# Patient Record
Sex: Male | Born: 1955 | Race: White | Hispanic: No | Marital: Married | Smoking: Former smoker
Health system: Southern US, Community
[De-identification: ages and names within clinical notes are randomized; demographics above are authoritative.]

## PROBLEM LIST (undated history)

## (undated) DIAGNOSIS — G4733 Obstructive sleep apnea (adult) (pediatric): Secondary | ICD-10-CM

## (undated) DIAGNOSIS — T7840XA Allergy, unspecified, initial encounter: Secondary | ICD-10-CM

## (undated) DIAGNOSIS — G4730 Sleep apnea, unspecified: Secondary | ICD-10-CM

## (undated) DIAGNOSIS — I10 Essential (primary) hypertension: Secondary | ICD-10-CM

## (undated) DIAGNOSIS — Z9989 Dependence on other enabling machines and devices: Secondary | ICD-10-CM

## (undated) DIAGNOSIS — D689 Coagulation defect, unspecified: Secondary | ICD-10-CM

## (undated) DIAGNOSIS — I2510 Atherosclerotic heart disease of native coronary artery without angina pectoris: Principal | ICD-10-CM

## (undated) DIAGNOSIS — N529 Male erectile dysfunction, unspecified: Secondary | ICD-10-CM

## (undated) DIAGNOSIS — E119 Type 2 diabetes mellitus without complications: Secondary | ICD-10-CM

## (undated) DIAGNOSIS — I712 Thoracic aortic aneurysm, without rupture: Secondary | ICD-10-CM

## (undated) DIAGNOSIS — R42 Dizziness and giddiness: Secondary | ICD-10-CM

## (undated) DIAGNOSIS — Z860100 Personal history of colon polyps, unspecified: Secondary | ICD-10-CM

## (undated) DIAGNOSIS — K219 Gastro-esophageal reflux disease without esophagitis: Secondary | ICD-10-CM

## (undated) DIAGNOSIS — E785 Hyperlipidemia, unspecified: Secondary | ICD-10-CM

## (undated) DIAGNOSIS — M1990 Unspecified osteoarthritis, unspecified site: Secondary | ICD-10-CM

## (undated) HISTORY — DX: Obstructive sleep apnea (adult) (pediatric): Z99.89

## (undated) HISTORY — DX: Allergy, unspecified, initial encounter: T78.40XA

## (undated) HISTORY — DX: Sleep apnea, unspecified: G47.30

## (undated) HISTORY — DX: Male erectile dysfunction, unspecified: N52.9

## (undated) HISTORY — DX: Hyperlipidemia, unspecified: E78.5

## (undated) HISTORY — DX: Personal history of colonic polyps: Z86.010

## (undated) HISTORY — DX: Thoracic aortic aneurysm, without rupture: I71.2

## (undated) HISTORY — DX: Atherosclerotic heart disease of native coronary artery without angina pectoris: I25.10

## (undated) HISTORY — PX: POLYPECTOMY: SHX149

## (undated) HISTORY — DX: Unspecified osteoarthritis, unspecified site: M19.90

## (undated) HISTORY — DX: Dizziness and giddiness: R42

## (undated) HISTORY — DX: Personal history of colon polyps, unspecified: Z86.0100

## (undated) HISTORY — DX: Coagulation defect, unspecified: D68.9

## (undated) HISTORY — DX: Essential (primary) hypertension: I10

## (undated) HISTORY — DX: Obstructive sleep apnea (adult) (pediatric): G47.33

## (undated) HISTORY — PX: UPPER GASTROINTESTINAL ENDOSCOPY: SHX188

## (undated) HISTORY — DX: Gastro-esophageal reflux disease without esophagitis: K21.9

---
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
## 1994-06-26 HISTORY — PX: APPENDECTOMY: SHX54

## 1997-10-05 ENCOUNTER — Inpatient Hospital Stay (HOSPITAL_COMMUNITY): Admission: AD | Admit: 1997-10-05 | Discharge: 1997-10-06 | Payer: Self-pay | Admitting: Orthopedic Surgery

## 1997-10-07 ENCOUNTER — Ambulatory Visit (HOSPITAL_COMMUNITY): Admission: RE | Admit: 1997-10-07 | Discharge: 1997-10-07 | Payer: Self-pay | Admitting: Orthopedic Surgery

## 1997-10-09 ENCOUNTER — Ambulatory Visit (HOSPITAL_COMMUNITY): Admission: RE | Admit: 1997-10-09 | Discharge: 1997-10-09 | Payer: Self-pay | Admitting: Orthopedic Surgery

## 1997-10-12 ENCOUNTER — Ambulatory Visit (HOSPITAL_COMMUNITY): Admission: RE | Admit: 1997-10-12 | Discharge: 1997-10-12 | Payer: Self-pay | Admitting: Orthopedic Surgery

## 1997-10-21 ENCOUNTER — Ambulatory Visit (HOSPITAL_COMMUNITY): Admission: RE | Admit: 1997-10-21 | Discharge: 1997-10-21 | Payer: Self-pay | Admitting: Orthopedic Surgery

## 1997-11-06 ENCOUNTER — Ambulatory Visit (HOSPITAL_COMMUNITY): Admission: RE | Admit: 1997-11-06 | Discharge: 1997-11-06 | Payer: Self-pay | Admitting: Orthopedic Surgery

## 1997-11-25 ENCOUNTER — Encounter (HOSPITAL_COMMUNITY): Admission: RE | Admit: 1997-11-25 | Discharge: 1998-02-23 | Payer: Self-pay | Admitting: Orthopedic Surgery

## 2003-05-15 ENCOUNTER — Ambulatory Visit (HOSPITAL_BASED_OUTPATIENT_CLINIC_OR_DEPARTMENT_OTHER): Admission: RE | Admit: 2003-05-15 | Discharge: 2003-05-15 | Payer: Self-pay | Admitting: Critical Care Medicine

## 2004-11-11 ENCOUNTER — Ambulatory Visit: Payer: Self-pay | Admitting: Internal Medicine

## 2004-11-15 ENCOUNTER — Ambulatory Visit: Payer: Self-pay | Admitting: Internal Medicine

## 2005-05-16 ENCOUNTER — Ambulatory Visit: Payer: Self-pay | Admitting: Internal Medicine

## 2005-05-24 ENCOUNTER — Encounter: Payer: Self-pay | Admitting: Cardiology

## 2005-05-24 ENCOUNTER — Ambulatory Visit: Payer: Self-pay

## 2005-06-26 HISTORY — PX: LASIK: SHX215

## 2005-11-14 ENCOUNTER — Ambulatory Visit: Payer: Self-pay | Admitting: Internal Medicine

## 2005-11-21 ENCOUNTER — Ambulatory Visit: Payer: Self-pay | Admitting: Internal Medicine

## 2006-02-21 ENCOUNTER — Ambulatory Visit: Payer: Self-pay | Admitting: Internal Medicine

## 2006-03-02 ENCOUNTER — Ambulatory Visit: Payer: Self-pay | Admitting: Internal Medicine

## 2006-04-13 ENCOUNTER — Ambulatory Visit: Payer: Self-pay | Admitting: Internal Medicine

## 2006-07-10 ENCOUNTER — Ambulatory Visit: Payer: Self-pay | Admitting: Gastroenterology

## 2006-07-20 ENCOUNTER — Ambulatory Visit: Payer: Self-pay | Admitting: Gastroenterology

## 2006-08-16 ENCOUNTER — Ambulatory Visit: Payer: Self-pay | Admitting: Internal Medicine

## 2007-06-11 ENCOUNTER — Ambulatory Visit: Payer: Self-pay | Admitting: Internal Medicine

## 2007-06-11 LAB — CONVERTED CEMR LAB
ALT: 50 units/L (ref 0–53)
AST: 28 units/L (ref 0–37)
Albumin: 3.8 g/dL (ref 3.5–5.2)
Alkaline Phosphatase: 59 units/L (ref 39–117)
BUN: 8 mg/dL (ref 6–23)
Basophils Relative: 0.1 % (ref 0.0–1.0)
Bilirubin Urine: NEGATIVE
CO2: 28 meq/L (ref 19–32)
GFR calc Af Amer: 114 mL/min
GFR calc non Af Amer: 94 mL/min
Glucose, Bld: 124 mg/dL — ABNORMAL HIGH (ref 70–99)
HCT: 41.4 % (ref 39.0–52.0)
Hemoglobin: 14.5 g/dL (ref 13.0–17.0)
Leukocytes, UA: NEGATIVE
MCHC: 35 g/dL (ref 30.0–36.0)
Monocytes Absolute: 0.8 10*3/uL — ABNORMAL HIGH (ref 0.2–0.7)
Neutro Abs: 4.6 10*3/uL (ref 1.4–7.7)
PSA: 0.22 ng/mL (ref 0.10–4.00)
Potassium: 3.6 meq/L (ref 3.5–5.1)
RBC: 4.43 M/uL (ref 4.22–5.81)
RDW: 12.4 % (ref 11.5–14.6)
Specific Gravity, Urine: >=1.03 (ref 1.000–1.03)
TSH: 0.76 microintl units/mL (ref 0.35–5.50)
Total Bilirubin: 1.4 mg/dL — ABNORMAL HIGH (ref 0.3–1.2)
Total CHOL/HDL Ratio: 5.4
Triglycerides: 126 mg/dL (ref 0–149)
WBC: 7.8 10*3/uL (ref 4.5–10.5)
pH: 5.5 (ref 5.0–8.0)

## 2007-06-18 ENCOUNTER — Ambulatory Visit: Payer: Self-pay | Admitting: Internal Medicine

## 2007-06-18 DIAGNOSIS — M79609 Pain in unspecified limb: Secondary | ICD-10-CM | POA: Insufficient documentation

## 2007-06-18 DIAGNOSIS — K219 Gastro-esophageal reflux disease without esophagitis: Secondary | ICD-10-CM | POA: Insufficient documentation

## 2007-06-18 DIAGNOSIS — I10 Essential (primary) hypertension: Secondary | ICD-10-CM | POA: Insufficient documentation

## 2007-06-18 DIAGNOSIS — R739 Hyperglycemia, unspecified: Secondary | ICD-10-CM | POA: Insufficient documentation

## 2007-06-18 DIAGNOSIS — E785 Hyperlipidemia, unspecified: Secondary | ICD-10-CM | POA: Insufficient documentation

## 2007-06-18 DIAGNOSIS — M1990 Unspecified osteoarthritis, unspecified site: Secondary | ICD-10-CM | POA: Insufficient documentation

## 2007-06-25 ENCOUNTER — Telehealth: Payer: Self-pay | Admitting: Internal Medicine

## 2007-06-27 HISTORY — PX: KNEE ARTHROSCOPY: SUR90

## 2007-09-06 ENCOUNTER — Ambulatory Visit: Payer: Self-pay | Admitting: Internal Medicine

## 2007-09-07 LAB — CONVERTED CEMR LAB
Calcium: 9.6 mg/dL (ref 8.4–10.5)
Chloride: 106 meq/L (ref 96–112)
Cholesterol: 211 mg/dL (ref 0–200)
Direct LDL: 153.4 mg/dL
GFR calc Af Amer: 114 mL/min
HDL: 28.5 mg/dL — ABNORMAL LOW (ref >39.0)
Hgb A1c MFr Bld: 5.7 % (ref 4.6–6.0)
Sodium: 142 meq/L (ref 135–145)
Total CK: 121 units/L (ref 7–195)

## 2007-09-17 ENCOUNTER — Ambulatory Visit: Payer: Self-pay | Admitting: Internal Medicine

## 2007-09-30 ENCOUNTER — Telehealth: Payer: Self-pay | Admitting: Internal Medicine

## 2007-10-02 ENCOUNTER — Telehealth: Payer: Self-pay | Admitting: Internal Medicine

## 2007-12-26 ENCOUNTER — Ambulatory Visit: Payer: Self-pay | Admitting: Internal Medicine

## 2007-12-26 LAB — CONVERTED CEMR LAB
BUN: 14 mg/dL (ref 6–23)
Calcium: 9.5 mg/dL (ref 8.4–10.5)
Chloride: 105 meq/L (ref 96–112)
Cholesterol: 240 mg/dL (ref 0–200)
Creatinine, Ser: 0.9 mg/dL (ref 0.4–1.5)
Direct LDL: 169.6 mg/dL
GFR calc Af Amer: 113 mL/min
GFR calc non Af Amer: 93 mL/min
Glucose, Bld: 99 mg/dL (ref 70–99)
Total CHOL/HDL Ratio: 9.4
Vitamin B-12: 335 pg/mL (ref 211–911)

## 2008-01-01 ENCOUNTER — Ambulatory Visit: Payer: Self-pay | Admitting: Internal Medicine

## 2008-01-01 DIAGNOSIS — M2550 Pain in unspecified joint: Secondary | ICD-10-CM | POA: Insufficient documentation

## 2008-01-14 ENCOUNTER — Telehealth: Payer: Self-pay | Admitting: Internal Medicine

## 2008-04-21 ENCOUNTER — Telehealth: Payer: Self-pay | Admitting: Internal Medicine

## 2008-06-11 ENCOUNTER — Ambulatory Visit (HOSPITAL_COMMUNITY): Admission: RE | Admit: 2008-06-11 | Discharge: 2008-06-11 | Payer: Self-pay | Admitting: Orthopaedic Surgery

## 2008-06-26 HISTORY — PX: SHOULDER ARTHROSCOPY W/ ROTATOR CUFF REPAIR: SHX2400

## 2009-10-18 ENCOUNTER — Ambulatory Visit: Payer: Self-pay | Admitting: Internal Medicine

## 2009-10-18 LAB — CONVERTED CEMR LAB
ALT: 48 units/L (ref 0–53)
AST: 28 units/L (ref 0–37)
Alkaline Phosphatase: 58 units/L (ref 39–117)
Basophils Absolute: 0 10*3/uL (ref 0.0–0.1)
Basophils Relative: 0.5 % (ref 0.0–3.0)
CO2: 31 meq/L (ref 19–32)
Calcium: 9.4 mg/dL (ref 8.4–10.5)
Chloride: 105 meq/L (ref 96–112)
Creatinine, Ser: 0.9 mg/dL (ref 0.4–1.5)
Direct LDL: 139.9 mg/dL
Eosinophils Absolute: 0.2 10*3/uL (ref 0.0–0.7)
GFR calc non Af Amer: 92.81 mL/min (ref >60)
Glucose, Bld: 94 mg/dL (ref 70–99)
HCT: 43.4 % (ref 39.0–52.0)
Lymphs Abs: 1.9 10*3/uL (ref 0.7–4.0)
MCHC: 34.5 g/dL (ref 30.0–36.0)
Monocytes Relative: 12 % (ref 3.0–12.0)
Neutro Abs: 3.8 10*3/uL (ref 1.4–7.7)
Neutrophils Relative %: 56.2 % (ref 43.0–77.0)
Platelets: 255 10*3/uL (ref 150.0–400.0)
Potassium: 4.5 meq/L (ref 3.5–5.1)
RDW: 12.9 % (ref 11.5–14.6)
Sodium: 143 meq/L (ref 135–145)
Specific Gravity, Urine: 1.025 (ref 1.000–1.030)
Urobilinogen, UA: 0.2 (ref 0.0–1.0)
WBC: 6.7 10*3/uL (ref 4.5–10.5)

## 2009-10-22 ENCOUNTER — Telehealth (INDEPENDENT_AMBULATORY_CARE_PROVIDER_SITE_OTHER): Payer: Self-pay | Admitting: *Deleted

## 2009-10-22 ENCOUNTER — Ambulatory Visit: Payer: Self-pay | Admitting: Internal Medicine

## 2009-10-22 DIAGNOSIS — Z860100 Personal history of colon polyps, unspecified: Secondary | ICD-10-CM | POA: Insufficient documentation

## 2009-10-22 DIAGNOSIS — G4733 Obstructive sleep apnea (adult) (pediatric): Secondary | ICD-10-CM | POA: Insufficient documentation

## 2009-10-27 ENCOUNTER — Encounter: Payer: Self-pay | Admitting: Internal Medicine

## 2009-10-27 DIAGNOSIS — I739 Peripheral vascular disease, unspecified: Secondary | ICD-10-CM | POA: Insufficient documentation

## 2009-10-28 ENCOUNTER — Ambulatory Visit: Payer: Self-pay

## 2009-10-28 ENCOUNTER — Encounter: Payer: Self-pay | Admitting: Internal Medicine

## 2009-11-04 ENCOUNTER — Telehealth: Payer: Self-pay | Admitting: Internal Medicine

## 2009-11-10 ENCOUNTER — Telehealth: Payer: Self-pay | Admitting: Internal Medicine

## 2010-01-21 ENCOUNTER — Ambulatory Visit: Payer: Self-pay | Admitting: Internal Medicine

## 2010-01-21 DIAGNOSIS — J069 Acute upper respiratory infection, unspecified: Secondary | ICD-10-CM | POA: Insufficient documentation

## 2010-01-24 ENCOUNTER — Telehealth: Payer: Self-pay | Admitting: Gastroenterology

## 2010-06-26 DIAGNOSIS — R42 Dizziness and giddiness: Secondary | ICD-10-CM

## 2010-06-26 HISTORY — DX: Dizziness and giddiness: R42

## 2010-07-26 NOTE — Progress Notes (Signed)
Summary: rx clarification  Phone Note Call from Patient Call back at Home Phone [phone]   Summary of Call: Karma Greaser called asking how patient is to take Ropinirole. She he take step therapy/cut the tab and increase gradually or go ahead and take the entire tablet. Please advise. Initial call taken by: Lucious Groves,  Nov 04, 2009 12:59 PM  Follow-up for Phone Call        without titration pak recommend taking 1mg  - 1/2 tab once daily x 5 days then 1 tab qhs Follow-up by: Jacques Navy MD,  Nov 04, 2009 6:26 PM  Additional Follow-up for Phone Call Additional follow up Details #1::        Pt informed  Additional Follow-up by: Lamar Sprinkles, CMA,  Nov 05, 2009 10:02 AM

## 2010-07-26 NOTE — Progress Notes (Signed)
Summary: Colonoscopy  Phone Note Outgoing Call   Summary of Call: Per MD patient is not due for colon until 2013. Called patient to notify, left message on machine to call back to office. Initial call taken by: Lucious Groves,  Nov 10, 2009 10:22 AM  Follow-up for Phone Call        received note to call Verlon Au about patient. I notified Verlon Au that patient is not due for colon until 2013. They were aware but due to multiple family members having colon cancer it has been suggested that the patient have his exam early. I made Verlon Au aware that he can call GI to discuss having done early/insurance in regards to the procedure. Follow-up by: Lucious Groves,  Nov 12, 2009 2:09 PM

## 2010-07-26 NOTE — Miscellaneous (Signed)
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 

## 2010-07-26 NOTE — Progress Notes (Signed)
Summary: Early recall colonoscopy  Phone Note Outgoing Call Call back at [facility] Phone [phone] Call back at cell [phone]   Call placed by: Christie Nottingham CMA Duncan Dull),  January 24, 2010 8:32 AM Call placed to: Patient Summary of Call: Called pt regarding early recall colonoscopy for family history of colon cancer and prior adenomatous colon polyps. Pt states he had a sister who died from colon cancer at age 57 and wants to have his colonoscopy early. Dr Russella Dar has reviewed the chart and agrees that this is reasonable due to his strong family history. Pt denies any GI symptoms at this time.  Pt wants to know if his insurance will cover the procedure to have it done early. I told him to contact his insurance company and ask if it is covered and call us back to schedule. Pt states he would be happy to call his insurance company and will call us back to schedule his procedure if his insurance company will pay for it early.  Initial call taken by: Christie Nottingham CMA Duncan Dull),  January 24, 2010 8:37 AM

## 2010-07-26 NOTE — Assessment & Plan Note (Signed)
Summary: 3 MOS F/U // # CD   Vital Signs:  Patient profile:   55 year old male Weight:      228 pounds O2 Sat:      96 % on Room air Temp:     98.3 degrees F oral Pulse rate:   65 / minute Pulse rhythm:   regular Resp:     16 per minute BP sitting:   128 / 78  (left arm) Cuff size:   large  Vitals Entered By: Lanier Prude, CMA(AAMA) (January 21, 2010 3:16 PM)  O2 Flow:  Room air CC: 3 mo f/u c/o cough/sore throat X 2 wks Comments pt needs new RX for Prevacid 30mg    CC:  3 mo f/u c/o cough/sore throat X 2 wks.  History of Present Illness: The patient presents with complaints of sore throat, fever, cough, sinus congestion and drainge of several days duration. Not better with OTC meds. Chest hurts with coughing. Can't sleep due to cough. Muscle aches are present.  The mucus is colored. Went to Clinic  C/o GERD - worse F/u polyps  Current Medications (verified): 1)  Requip 1 Mg  Tabs (Ropinirole Hcl) .Marland Kitchen.. 1 By Mouth Q Hs As Needed Restless Legs 2)  Vitamin D3 1000 Unit  Tabs (Cholecalciferol) .Marland Kitchen.. 1 By Mouth Daily 3)  Fish Oil   Oil (Fish Oil) .Marland Kitchen.. 1 By Mouth Bid 4)  Aspirin 81 Mg  Tbec (Aspirin) .... One By Mouth Every Day 5)  Cpap Macine/mask/supplies .... As Dirr 6)  Naproxen 500 Mg Tabs (Naproxen) .Marland Kitchen.. 1 By Mouth Two Times A Day Pc For Pain/arthritis 7)  Tramadol Hcl 50 Mg Tabs (Tramadol Hcl) .Marland Kitchen.. 1-2 Tabs By Mouth Two Times A Day As Needed Pain 8)  Benzonatate 100 Mg Caps (Benzonatate) .... As Directed 9)  Cheratussin Ac 100-10 Mg/55ml Syrp (Guaifenesin-Codeine) .... As Directed 10)  Ventolin Hfa 108 (90 Base) Mcg/act Aers (Albuterol Sulfate) .... As Directed  Allergies (verified): 1)  Lipitor  Past History:  Past Medical History: Last updated: 10/22/2009 GERD Hyperlipidemia Hypertension ED OSA on CPAP Osteoarthritis Colonic polyps, hx of  Dr Russella Dar  Family History: Last updated: 01/01/2008 Family History of Colon CA 1st degree relative <60 F kidney and  prostate Ca No CAD or CVAs  Social History: Last updated: 10/22/2009 Occupation: plumbing Married Former Smoker  Review of Systems       The patient complains of fever, dyspnea on exertion, and prolonged cough.  The patient denies melena and hematochezia.    Physical Exam  General:  Well-developed,well-nourished,in no acute distress; alert,appropriate and cooperative throughout examination Ears:  External ear exam shows no significant lesions or deformities.  Otoscopic examination reveals clear canals, tympanic membranes are intact bilaterally without bulging, retraction, inflammation or discharge. Hearing is grossly normal bilaterally. Mouth:  Erythematous throat and intranasal mucosa c/w URI  Neck:  No deformities, masses, or tenderness noted. Lungs:  Decr BS at L base Heart:  Normal rate and regular rhythm. S1 and S2 normal without gallop, murmur, click, rub or other extra sounds. Abdomen:  Bowel sounds positive,abdomen soft and non-tender without masses, organomegaly or hernias noted. Msk:  No deformity or scoliosis noted of thoracic or lumbar spine.  B knees tender w/ROM Neurologic:  No cranial nerve deficits noted. Station and gait are normal. Plantar reflexes are down-going bilaterally. DTRs are symmetrical throughout. Sensory, motor and coordinative functions appear intact. Skin:  Intact without suspicious lesions or rashes Psych:  Cognition and judgment appear intact. Alert  and cooperative with normal attention span and concentration. No apparent delusions, illusions, hallucinations   Impression & Recommendations:  Problem # 1:  UPPER RESPIRATORY INFECTION, ACUTE (ICD-465.9) - r/o pneumonia Assessment New  His updated medication list for this problem includes:    Aspirin 81 Mg Tbec (Aspirin) ..... One by mouth every day    Naproxen 500 Mg Tabs (Naproxen) .Marland Kitchen... 1 by mouth two times a day pc for pain/arthritis    Benzonatate 100 Mg Caps (Benzonatate) .Marland Kitchen... As directed     Cheratussin Ac 100-10 Mg/37ml Syrp (Guaifenesin-codeine) .Marland Kitchen... As directed    Promethazine-codeine 6.25-10 Mg/70ml Syrp (Promethazine-codeine) .Marland Kitchen... 5-10 ml by mouth q id as needed cough  Orders: T-2 View CXR, Same Day (71020.5TC)  Problem # 2:  COLONIC POLYPS, HX OF (ICD-V12.72) Assessment: Unchanged He would like to be scoped sooner than 2013  Problem # 3:  GERD (ICD-530.81) Assessment: Deteriorated Given Nexium samples 40 mg daily  Problem # 4:  OSTEOARTHRITIS (ICD-715.90) Assessment: Improved  His updated medication list for this problem includes:    Aspirin 81 Mg Tbec (Aspirin) ..... One by mouth every day    Naproxen 500 Mg Tabs (Naproxen) .Marland Kitchen... 1 by mouth two times a day pc for pain/arthritis    Tramadol Hcl 50 Mg Tabs (Tramadol hcl) .Marland Kitchen... 1-2 tabs by mouth two times a day as needed pain  Complete Medication List: 1)  Requip 1 Mg Tabs (Ropinirole hcl) .Marland Kitchen.. 1 by mouth q hs as needed restless legs 2)  Vitamin D3 1000 Unit Tabs (Cholecalciferol) .Marland Kitchen.. 1 by mouth daily 3)  Fish Oil Oil (Fish oil) .Marland Kitchen.. 1 by mouth bid 4)  Aspirin 81 Mg Tbec (Aspirin) .... One by mouth every day 5)  Cpap Macine/mask/supplies  .... As dirr 6)  Naproxen 500 Mg Tabs (Naproxen) .Marland Kitchen.. 1 by mouth two times a day pc for pain/arthritis 7)  Tramadol Hcl 50 Mg Tabs (Tramadol hcl) .Marland Kitchen.. 1-2 tabs by mouth two times a day as needed pain 8)  Benzonatate 100 Mg Caps (Benzonatate) .... As directed 9)  Cheratussin Ac 100-10 Mg/47ml Syrp (Guaifenesin-codeine) .... As directed 10)  Ventolin Hfa 108 (90 Base) Mcg/act Aers (Albuterol sulfate) .... As directed 11)  Levaquin 500 Mg Tabs (Levofloxacin) .Marland Kitchen.. 1 by mouth qd 12)  Prednisone 10 Mg Tabs (Prednisone) .... Take 40mg  qd for 3 days, then 20 mg qd for 3 days, then 10mg  qd for 6 days, then stop. take pc. 13)  Promethazine-codeine 6.25-10 Mg/90ml Syrp (Promethazine-codeine) .... 5-10 ml by mouth q id as needed cough  Patient Instructions: 1)  Call if you are not better in  a reasonable amount of time or if worse.  2)  Try to eat more raw plant food, fresh and dry fruit, raw almonds, leafy vegetables, whole foods and less red meat, less animal fat. Poultry and fish is better for you than pork and beef. Avoid processed foods (canned soups, hot dogs, sausage, bacon , frozen dinners). Avoid corn syrup, high fructose syrup or aspartam  containing drinks. Honey, Agave and Stevia are better sweeteners. Make your own  dressing with olive oil, wine vinegar, lemon juce, garlic etc. for your salads. Prescriptions: PROMETHAZINE-CODEINE 6.25-10 MG/5ML SYRP (PROMETHAZINE-CODEINE) 5-10 ml by mouth q id as needed cough  #300 ml x 0   Entered and Authorized by:   Tresa Garter MD   Signed by:   Tresa Garter MD on 01/21/2010   Method used:   Print then Give to Patient   RxID:  [id number] PREDNISONE 10 MG TABS (PREDNISONE) Take 40mg  qd for 3 days, then 20 mg qd for 3 days, then 10mg  qd for 6 days, then stop. Take pc.  #24 x 1   Entered and Authorized by:   Tresa Garter MD   Signed by:   Tresa Garter MD on 01/21/2010   Method used:   Print then Give to Patient   RxID:   [id number] LEVAQUIN 500 MG TABS (LEVOFLOXACIN) 1 by mouth qd  #10 x 0   Entered and Authorized by:   Tresa Garter MD   Signed by:   Tresa Garter MD on 01/21/2010   Method used:   Print then Give to Patient   RxID:   [id number]

## 2010-07-26 NOTE — Progress Notes (Signed)
----   Converted from flag ---- ---- 10/22/2009 11:40 AM, Edman Circle wrote: appt 5/5 @ 2:00  ---- 10/22/2009 11:24 AM, Dagoberto Reef wrote: Thanks ------------------------------

## 2010-07-26 NOTE — Assessment & Plan Note (Signed)
Summary: CPX/ NWS  #   Vital Signs:  Patient profile:   55 year old male Weight:      225 pounds O2 Sat:      96 % on Room air Temp:     97.9 degrees F oral Pulse rate:   60 / minute BP sitting:   128 / 82  (left arm) Cuff size:   large  Vitals Entered By: Bill Salinas CMA (October 22, 2009 10:22 AM)  O2 Flow:  Room air CC: Pt here for CPX, pt is due for tetanus shot and states he is no longer on Requip or crestor, pt would like to discuss requip for his restless leg/ ab   CC:  Pt here for CPX, pt is due for tetanus shot and states he is no longer on Requip or crestor, and pt would like to discuss requip for his restless leg/ ab.  History of Present Illness: The patient presents for a wellness examination  C/o knee pains, loosing hair on legs, stiff in am's Not taking Crestor 1 year  Current Medications (verified): 1)  Requip 1 Mg  Tabs (Ropinirole Hcl) .Marland Kitchen.. 1 By Mouth Q Hs As Needed Restless Legs 2)  Vitamin D3 1000 Unit  Tabs (Cholecalciferol) .Marland Kitchen.. 1 By Mouth Daily 3)  Fish Oil   Oil (Fish Oil) .Marland Kitchen.. 1 By Mouth Bid 4)  Aspirin 81 Mg  Tbec (Aspirin) .... One By Mouth Every Day 5)  Crestor 10 Mg Tabs (Rosuvastatin Calcium) .Marland Kitchen.. 1 Tablet By Mouth Daily  Allergies (verified): 1)  Lipitor  Past History:  Past Surgical History: Last updated: 06/18/2007 Lasik 07  Family History: Last updated: 01/01/2008 Family History of Colon CA 1st degree relative <60 F kidney and prostate Ca No CAD or CVAs  Past Medical History: GERD Hyperlipidemia Hypertension ED OSA on CPAP Osteoarthritis Colonic polyps, hx of  Dr Russella Dar  Social History: Occupation: plumbing Married Former Smoker  Review of Systems       The patient complains of difficulty walking.  The patient denies anorexia, fever, weight loss, weight gain, vision loss, decreased hearing, hoarseness, chest pain, syncope, dyspnea on exertion, peripheral edema, prolonged cough, headaches, hemoptysis, abdominal pain, melena,  hematochezia, severe indigestion/heartburn, hematuria, incontinence, genital sores, muscle weakness, suspicious skin lesions, transient blindness, depression, unusual weight change, abnormal bleeding, enlarged lymph nodes, angioedema, and testicular masses.    Physical Exam  General:  Well-developed,well-nourished,in no acute distress; alert,appropriate and cooperative throughout examination Head:  Normocephalic and atraumatic without obvious abnormalities. No apparent alopecia or balding. Eyes:  No corneal or conjunctival inflammation noted. EOMI. Perrla.  Ears:  External ear exam shows no significant lesions or deformities.  Otoscopic examination reveals clear canals, tympanic membranes are intact bilaterally without bulging, retraction, inflammation or discharge. Hearing is grossly normal bilaterally. Nose:  External nasal examination shows no deformity or inflammation. Nasal mucosa are pink and moist without lesions or exudates. Mouth:  Oral mucosa and oropharynx without lesions or exudates.  Teeth in good repair. Neck:  No deformities, masses, or tenderness noted. Lungs:  Normal respiratory effort, chest expands symmetrically. Lungs are clear to auscultation, no crackles or wheezes. Heart:  Normal rate and regular rhythm. S1 and S2 normal without gallop, murmur, click, rub or other extra sounds. Abdomen:  Bowel sounds positive,abdomen soft and non-tender without masses, organomegaly or hernias noted. Msk:  No deformity or scoliosis noted of thoracic or lumbar spine.  B knees tender w/ROM Pulses:  R and L carotid,radial,femoral,dorsalis pedis and posterior tibial  pulses are full and equal bilaterally Extremities:  No clubbing, cyanosis, edema, or deformity noted with normal full range of motion of all joints.   Neurologic:  No cranial nerve deficits noted. Station and gait are normal. Plantar reflexes are down-going bilaterally. DTRs are symmetrical throughout. Sensory, motor and coordinative  functions appear intact. Skin:  Intact without suspicious lesions or rashes Psych:  Cognition and judgment appear intact. Alert and cooperative with normal attention span and concentration. No apparent delusions, illusions, hallucinations   Impression & Recommendations:  Problem # 1:  PHYSICAL EXAMINATION (ICD-V70.0) Assessment New Health and age related issues were discussed. Available screening tests and vaccinations were discussed as well. Healthy life style including good diet and execise was discussed.  The labs were reviewed with the patient.   Problem # 2:  COLONIC POLYPS, HX OF (ICD-V12.72) Assessment: Comment Only  Orders: Gastroenterology Referral (GI)  Problem # 3:  ARTHRALGIA (ICD-719.40) Assessment: Deteriorated Get more labs  Problem # 4:  OSTEOARTHRITIS (ICD-715.90) Assessment: Deteriorated  His updated medication list for this problem includes:    Aspirin 81 Mg Tbec (Aspirin) ..... One by mouth every day    Naproxen 500 Mg Tabs (Naproxen) .Marland Kitchen... 1 by mouth two times a day pc for pain/arthritis    Tramadol Hcl 50 Mg Tabs (Tramadol hcl) .Marland Kitchen... 1-2 tabs by mouth two times a day as needed pain  Complete Medication List: 1)  Requip 1 Mg Tabs (Ropinirole hcl) .Marland Kitchen.. 1 by mouth q hs as needed restless legs 2)  Vitamin D3 1000 Unit Tabs (Cholecalciferol) .Marland Kitchen.. 1 by mouth daily 3)  Fish Oil Oil (Fish oil) .Marland Kitchen.. 1 by mouth bid 4)  Aspirin 81 Mg Tbec (Aspirin) .... One by mouth every day 5)  Cpap Macine/mask/supplies  .... As dirr 6)  Naproxen 500 Mg Tabs (Naproxen) .Marland Kitchen.. 1 by mouth two times a day pc for pain/arthritis 7)  Tramadol Hcl 50 Mg Tabs (Tramadol hcl) .Marland Kitchen.. 1-2 tabs by mouth two times a day as needed pain  Other Orders: Home Health Referral (Home Health) Tdap => 61yrs IM (16109) Admin 1st Vaccine (60454) Doppler Referral (Doppler)  Patient Instructions: 1)  Please schedule a follow-up appointment in 3 months. 2)  BMP prior to visit, ICD-9: 3)  Vit B12 782.0 4)   ANA, RF Prescriptions: TRAMADOL HCL 50 MG TABS (TRAMADOL HCL) 1-2 tabs by mouth two times a day as needed pain  #120 x 6   Entered and Authorized by:   Tresa Garter MD   Signed by:   Tresa Garter MD on 10/22/2009   Method used:   Print then Give to Patient   RxID:   [id number] NAPROXEN 500 MG TABS (NAPROXEN) 1 by mouth two times a day pc for pain/arthritis  #180 x 1   Entered and Authorized by:   Tresa Garter MD   Signed by:   Tresa Garter MD on 10/22/2009   Method used:   Print then Give to Patient   RxID:   [id number] REQUIP 1 MG  TABS (ROPINIROLE HCL) 1 by mouth q hs as needed restless legs  #90 x 3   Entered and Authorized by:   Tresa Garter MD   Signed by:   Tresa Garter MD on 10/22/2009   Method used:   Print then Give to Patient   RxID:   [id number] CPAP MACINE/MASK/SUPPLIES as dirr  #1 x 0   Entered and Authorized by:   Tresa Garter MD  Signed by:   Tresa Garter MD on 10/22/2009   Method used:   Print then Give to Patient   RxID:   [id number]    Immunizations Administered:  Tetanus Vaccine:    Vaccine Type: Tdap    Site: left deltoid    Mfr: GlaxoSmithKline    Dose: 0.5 ml    Route: IM    Given by: Ami Bullins CMA    Exp. Date: 09/18/2011    Lot #: [id number]    VIS given: 05/14/07 version given October 22, 2009.  Not Administered:    Influenza Vaccine # 1 not given due to: declined

## 2010-10-16 ENCOUNTER — Other Ambulatory Visit: Payer: Self-pay | Admitting: Internal Medicine

## 2010-11-02 ENCOUNTER — Encounter: Payer: Self-pay | Admitting: Internal Medicine

## 2010-11-04 ENCOUNTER — Ambulatory Visit (INDEPENDENT_AMBULATORY_CARE_PROVIDER_SITE_OTHER): Payer: 59 | Admitting: Internal Medicine

## 2010-11-04 ENCOUNTER — Encounter: Payer: Self-pay | Admitting: Internal Medicine

## 2010-11-04 DIAGNOSIS — R42 Dizziness and giddiness: Secondary | ICD-10-CM

## 2010-11-04 DIAGNOSIS — R635 Abnormal weight gain: Secondary | ICD-10-CM | POA: Insufficient documentation

## 2010-11-04 MED ORDER — MECLIZINE HCL 12.5 MG PO TABS: 12.5000 mg | ORAL_TABLET | Freq: Three times a day (TID) | ORAL | Status: DC

## 2010-11-04 NOTE — Assessment & Plan Note (Signed)
Meclizine B-D exercise given

## 2010-11-04 NOTE — Progress Notes (Signed)
  Subjective:    Patient ID: Jerry Cohen, male    DOB: August 02, 1955, 55 y.o.   MRN: [id number]  HPI  C/o dizzy spells since past Sat - got up off his bed and fell down due to spinning. Head to left is worse.  C/o wt gain.  Review of Systems  Constitutional: Positive for unexpected weight change. Negative for chills.  HENT: Negative for nosebleeds, rhinorrhea, sneezing and ear discharge.   Eyes: Negative for redness, itching and visual disturbance.  Respiratory: Negative for shortness of breath.   Cardiovascular: Negative for chest pain.  Genitourinary: Negative for hematuria.  Musculoskeletal: Negative for gait problem.  Neurological: Positive for dizziness.  Psychiatric/Behavioral: Negative for confusion and agitation.   Wt Readings from Last 3 Encounters:  11/04/10 240 lb (108.863 kg)  01/21/10 228 lb (103.42 kg)  10/22/09 225 lb (102.059 kg)   BP Readings from Last 3 Encounters:  11/04/10 140/90  01/21/10 128/78  10/22/09 128/82         Objective:   Physical Exam  Constitutional: He is oriented to person, place, and time. He appears well-developed.  HENT:  Mouth/Throat: Oropharynx is clear and moist.  Eyes: Conjunctivae are normal. Pupils are equal, round, and reactive to light.  Neck: Normal range of motion. No JVD present. No thyromegaly present.  Cardiovascular: Normal rate, regular rhythm, normal heart sounds and intact distal pulses.  Exam reveals no gallop and no friction rub.   No murmur heard. Pulmonary/Chest: Effort normal and breath sounds normal. No respiratory distress. He has no wheezes. He has no rales. He exhibits no tenderness.  Abdominal: Soft. Bowel sounds are normal. He exhibits no distension and no mass. There is no tenderness. There is no rebound and no guarding.  Musculoskeletal: Normal range of motion. He exhibits no edema and no tenderness.  Lymphadenopathy:    He has no cervical adenopathy.  Neurological: He is alert and oriented to person,  place, and time. He has normal reflexes. No cranial nerve deficit. He exhibits normal muscle tone. Coordination normal.       H-P is +++ on R  Skin: Skin is warm and dry. No rash noted.  Psychiatric: He has a normal mood and affect. His behavior is normal. Judgment and thought content normal.          Assessment & Plan:  Vertigo Meclizine B-D exercise given    HTN  On Rx

## 2010-11-04 NOTE — Patient Instructions (Signed)
Loose weight Call if not better No driving if sick

## 2010-11-08 NOTE — Op Note (Signed)
[name], Jerry Cohen               ACCOUNT NO.:  [id number]   MEDICAL RECORD NO.:  [id number]          PATIENT TYPE:  AMB   LOCATION:  SDS                          FACILITY:  [facility]   PHYSICIAN:  Jerry Cohen, Jerry CohenDATE OF BIRTH:  01/12/1956   DATE OF PROCEDURE:  06/11/2008  DATE OF DISCHARGE:                               OPERATIVE REPORT   PREOPERATIVE DIAGNOSES:  1. Left shoulder subscapularis tear.  2. Left shoulder biceps dislocation.  3. Left shoulder rotator cuff tear.  4. Left shoulder impingement.  5. Left shoulder acromioclavicular degeneration.   POSTOPERATIVE DIAGNOSES:  1. Left shoulder subscapularis tear.  2. Left shoulder biceps dislocation.  3. Left shoulder rotator cuff tear.  4. Left shoulder impingement.  5. Left shoulder acromioclavicular degeneration.   PROCEDURES:  1. Left shoulder arthroscopic subscapularis repair.  2. Left shoulder arthroscopic biceps tenodesis.  3. Left shoulder arthroscopic rotator cuff repair.  4. Left shoulder arthroscopic partial clavulectomy.  5. Arthroscopic acromioplasty.   ANESTHESIA:  General and block.   ATTENDING SURGEON:  Jerry Basque. Dalldorf, MD   INDICATIONS FOR PROCEDURE:  The patient is a 55 year old man who injured  his shoulder several months back.  He has pain with attempted use and  weakness behind his back and overhead.  He has pain which limits his  ability to rest and work.  By MRI scan, he has the aforementioned  problems with a large tear of the subscapularis with consequence  dislocation of biceps tendon along with a traditional rotator cuff tear  involving the supraspinatus.  He is offered arthroscopy.  Informed  operative consent was obtained after discussion of possible  complications of reaction to anesthesia and infection.   SUMMARY/FINDINGS/PROCEDURE:  Under general anesthesia and a block, an  arthroscopy of the left shoulder was performed.  In the glenohumeral  joint, he had no degenerative  changes.  His biceps tendon was completely  subluxed out of position due to a subscapularis tear.  The upper half of  the subscapularis was completely avulsed.  We performed the biceps  tenodesis and a subscapularis repair through the scope using a series of  sutures and a PushLock anchor.  In the subacromial space, he had a small  rotator cuff tear which was about a centimeter in size.  I repaired this  with a single PushLock and reverse mattress suture.  I also performed an  acromioplasty and a coplane of the AC joint.   DESCRIPTION OF PROCEDURE:  The patient was taken to the operating suite  where general anesthetic was applied without difficulty.  He was also  given a block in the preanesthesia area.  He was positioned in a beach-  chair position and prepped and draped in normal sterile fashion.  After  administration of IV Kefzol, an arthroscopy of the left shoulder was  performed through a total of 5 portals.  Findings were as noted above  and procedure consisted in the glenohumeral joint of the biceps  tenodesis with subscapularis repair.  We created a bleeding bed of bone  for the upper half of the subscap and passed  a reverse mattress suture  through this structure using a suture lasso device.  This was a  FiberWire suture.  We then placed a second suture through a portion of  the biceps tendon and released it from the superior aspect of the  glenoid.  I then repaired both of these structures to the lesser  tuberosity area with a PushLock anchor.  This seemed to reconstruct his  subscapularis well and this tendon now exhibited normal excursion with  rotation of the arm.  In the subacromial space, I cleared some bursal  tissues and unfortunately this did revealed a thick rotator cuff tear  which was about a centimeter in size and mildly retracted.  I thought  that we best repair this as well.  I created a bleeding bed of bone and  repaired this with a reverse mattress suture of  FiberWire and a  PushLock.  I then performed an acromioplasty with a bur in the lateral  position followed by transfer of bur to the posterior position.  He had  a moderately prominent subacromial morphology.  He also had a prominent  spur under the clavicle and I removed this with the bur as well.  The  shoulder was thoroughly irrigated followed by reapproximation of portals  loosely with nylon.  Adaptic was applied followed by dry gauze and tape.  Estimated blood loss and fluids can be obtained from anesthesia records.   DISPOSITION:  The patient was extubated in the operating room and taken  to recovery room in stable condition.  His plans were to go home same-  day with followup in the office in a week or two.  I will contact him by  phone tonight.      Jerry Basque Jerl Santos, M.D.  Electronically Signed     PGD/MEDQ  D:  06/11/2008  T:  06/11/2008  Job:  [id number]

## 2010-11-11 NOTE — Assessment & Plan Note (Signed)
[facility]                             PRIMARY CARE OFFICE NOTE   AQUARIUS, LATOUCHE                      MRN:          [id number]  DATE:03/02/2006                            DOB:          1956-03-29    PROCEDURE:  Skin biopsy.   INDICATIONS:  Moles, irritated.   Mole #1, measuring 3-4 mm at the base of the right neck, was prepped with  Betadine and alcohol, injected with 0.5 cc of 2% lidocaine with epinephrine.  A shave biopsy performed.  Specimen sent to the lab.  The wound treated with  hyfercater.  Complications none.  Wound instructions provided.   Mole #2, measuring 3 mm, left base of the neck, was removed in an identical  fashion.  Specimen sent to the lab.   Multiple skin tags were removed at no charge from the base of his neck  bilaterally.                                   Georgina Quint. Plotnikov, MD   AVP/MedQ  DD:  03/02/2006  DT:  03/02/2006  Job #:  [id number]

## 2010-11-17 ENCOUNTER — Other Ambulatory Visit: Payer: Self-pay | Admitting: Internal Medicine

## 2011-03-02 ENCOUNTER — Other Ambulatory Visit: Payer: Self-pay | Admitting: *Deleted

## 2011-03-02 DIAGNOSIS — Z0389 Encounter for observation for other suspected diseases and conditions ruled out: Secondary | ICD-10-CM

## 2011-03-02 DIAGNOSIS — Z0000 Encounter for general adult medical examination without abnormal findings: Secondary | ICD-10-CM

## 2011-03-03 ENCOUNTER — Other Ambulatory Visit: Payer: Self-pay | Admitting: Internal Medicine

## 2011-03-03 ENCOUNTER — Other Ambulatory Visit: Payer: 59

## 2011-03-10 ENCOUNTER — Telehealth: Payer: Self-pay | Admitting: Internal Medicine

## 2011-03-10 ENCOUNTER — Ambulatory Visit (INDEPENDENT_AMBULATORY_CARE_PROVIDER_SITE_OTHER): Payer: 59 | Admitting: Internal Medicine

## 2011-03-10 ENCOUNTER — Other Ambulatory Visit: Payer: Self-pay | Admitting: Internal Medicine

## 2011-03-10 ENCOUNTER — Encounter: Payer: Self-pay | Admitting: Internal Medicine

## 2011-03-10 ENCOUNTER — Other Ambulatory Visit (INDEPENDENT_AMBULATORY_CARE_PROVIDER_SITE_OTHER): Payer: 59

## 2011-03-10 VITALS — BP 128/88 | HR 64 | Temp 98.1°F | Resp 16 | Wt 240.0 lb

## 2011-03-10 DIAGNOSIS — R635 Abnormal weight gain: Secondary | ICD-10-CM

## 2011-03-10 DIAGNOSIS — K439 Ventral hernia without obstruction or gangrene: Secondary | ICD-10-CM

## 2011-03-10 DIAGNOSIS — Z136 Encounter for screening for cardiovascular disorders: Secondary | ICD-10-CM

## 2011-03-10 DIAGNOSIS — Z0389 Encounter for observation for other suspected diseases and conditions ruled out: Secondary | ICD-10-CM

## 2011-03-10 DIAGNOSIS — Z0000 Encounter for general adult medical examination without abnormal findings: Secondary | ICD-10-CM

## 2011-03-10 DIAGNOSIS — R42 Dizziness and giddiness: Secondary | ICD-10-CM

## 2011-03-10 LAB — CBC WITH DIFFERENTIAL/PLATELET
Basophils Absolute: 0 10*3/uL (ref 0.0–0.1)
Eosinophils Relative: 3.1 % (ref 0.0–5.0)
MCV: 94.5 fl (ref 78.0–100.0)
Monocytes Absolute: 0.7 10*3/uL (ref 0.1–1.0)
Neutrophils Relative %: 61.5 % (ref 43.0–77.0)
Platelets: 273 10*3/uL (ref 150.0–400.0)
RDW: 13.1 % (ref 11.5–14.6)
WBC: 7.7 10*3/uL (ref 4.5–10.5)

## 2011-03-10 LAB — TSH: TSH: 0.84 u[IU]/mL (ref 0.35–5.50)

## 2011-03-10 LAB — BASIC METABOLIC PANEL
BUN: 14 mg/dL (ref 6–23)
CO2: 27 mEq/L (ref 19–32)
Calcium: 9.2 mg/dL (ref 8.4–10.5)
Creatinine, Ser: 0.8 mg/dL (ref 0.4–1.5)
Glucose, Bld: 98 mg/dL (ref 70–99)

## 2011-03-10 LAB — URINALYSIS, ROUTINE W REFLEX MICROSCOPIC
Bilirubin Urine: NEGATIVE
Leukocytes, UA: NEGATIVE
Nitrite: NEGATIVE
Specific Gravity, Urine: 1.02 (ref 1.000–1.030)
pH: 6 (ref 5.0–8.0)

## 2011-03-10 LAB — LDL CHOLESTEROL, DIRECT: Direct LDL: 167.1 mg/dL

## 2011-03-10 LAB — PSA: PSA: 0.31 ng/mL (ref 0.10–4.00)

## 2011-03-10 LAB — HEPATIC FUNCTION PANEL
Bilirubin, Direct: 0.2 mg/dL (ref 0.0–0.3)
Total Bilirubin: 1.6 mg/dL — ABNORMAL HIGH (ref 0.3–1.2)

## 2011-03-10 LAB — LIPID PANEL
Cholesterol: 225 mg/dL — ABNORMAL HIGH (ref 0–200)
Total CHOL/HDL Ratio: 7
Triglycerides: 147 mg/dL (ref 0.0–149.0)

## 2011-03-10 MED ORDER — TRIAMCINOLONE ACETONIDE 0.5 % EX CREA: TOPICAL_CREAM | Freq: Three times a day (TID) | CUTANEOUS | Status: DC

## 2011-03-10 NOTE — Assessment & Plan Note (Signed)
Diet discussed 

## 2011-03-10 NOTE — Telephone Encounter (Signed)
Pt's wife Verlon Au informed.

## 2011-03-10 NOTE — Progress Notes (Signed)
  Subjective:    Patient ID: Jerry Cohen, male    DOB: 1955/10/22, 55 y.o.   MRN: [id number]  HPI  The patient is here for a wellness exam. The patient has been doing well overall without major physical or psychological issues going on lately.Review of Systems  Constitutional: Negative for appetite change, fatigue and unexpected weight change.  HENT: Negative for nosebleeds, congestion, sore throat, sneezing, trouble swallowing and neck pain.   Eyes: Negative for itching and visual disturbance.  Respiratory: Negative for cough.   Cardiovascular: Negative for chest pain, palpitations and leg swelling.  Gastrointestinal: Negative for nausea, diarrhea, blood in stool and abdominal distention.       Hernia  Genitourinary: Negative for frequency and hematuria.  Musculoskeletal: Negative for back pain, joint swelling and gait problem.  Skin: Negative for rash.  Neurological: Negative for dizziness, tremors, speech difficulty and weakness.  Psychiatric/Behavioral: Negative for sleep disturbance, dysphoric mood and agitation. The patient is not nervous/anxious.        Objective:   Physical Exam  Constitutional: He is oriented to person, place, and time. He appears well-developed and well-nourished. No distress.  HENT:  Head: Normocephalic and atraumatic.  Right Ear: External ear normal.  Left Ear: External ear normal.  Nose: Nose normal.  Mouth/Throat: Oropharynx is clear and moist. No oropharyngeal exudate.  Eyes: Conjunctivae and EOM are normal. Pupils are equal, round, and reactive to light. Right eye exhibits no discharge. Left eye exhibits no discharge. No scleral icterus.  Neck: Normal range of motion. Neck supple. No JVD present. No tracheal deviation present. No thyromegaly present.  Cardiovascular: Normal rate, regular rhythm, normal heart sounds and intact distal pulses.  Exam reveals no gallop and no friction rub.   No murmur heard. Pulmonary/Chest: Effort normal and breath  sounds normal. No stridor. No respiratory distress. He has no wheezes. He has no rales. He exhibits no tenderness.  Abdominal: Soft. Bowel sounds are normal. He exhibits no distension and no mass. There is no tenderness. There is no rebound and no guarding.       Midline heria NT  Genitourinary: Rectum normal, prostate normal and penis normal. Guaiac negative stool. No penile tenderness.  Musculoskeletal: Normal range of motion. He exhibits no edema and no tenderness.  Lymphadenopathy:    He has no cervical adenopathy.  Neurological: He is alert and oriented to person, place, and time. He has normal reflexes. No cranial nerve deficit. He exhibits normal muscle tone. Coordination normal.  Skin: Skin is warm and dry. No rash noted. He is not diaphoretic. No erythema. No pallor.  Psychiatric: He has a normal mood and affect. His behavior is normal. Judgment and thought content normal.      Lab Results  Component Value Date   WBC 7.7 03/10/2011   HGB 14.4 03/10/2011   HCT 42.8 03/10/2011   PLT 273.0 03/10/2011   CHOL 225* 03/10/2011   TRIG 147.0 03/10/2011   HDL 33.10* 03/10/2011   LDLDIRECT 167.1 03/10/2011   ALT 64* 03/10/2011   AST 38* 03/10/2011   NA 138 03/10/2011   K 4.0 03/10/2011   CL 104 03/10/2011   CREATININE 0.8 03/10/2011   BUN 14 03/10/2011   CO2 27 03/10/2011   TSH 0.84 03/10/2011   PSA 0.31 03/10/2011   HGBA1C 5.7 09/06/2007      Assessment & Plan:

## 2011-03-10 NOTE — Telephone Encounter (Signed)
Stacey , please, inform the patient: labs are OK   Please, keep  next office visit appointment.   Thank you !   

## 2011-03-10 NOTE — Assessment & Plan Note (Signed)
Some relapses Benign Positional Vertigo symptoms on the left. Start Meclizine. Start Francee Piccolo - Daroff exercise several times a day as dirrected.

## 2011-03-28 ENCOUNTER — Other Ambulatory Visit: Payer: Self-pay | Admitting: Internal Medicine

## 2011-03-31 LAB — BASIC METABOLIC PANEL
BUN: 8 mg/dL (ref 6–23)
Chloride: 102 mEq/L (ref 96–112)
GFR calc Af Amer: >60 mL/min (ref >60)
GFR calc non Af Amer: >60 mL/min (ref >60)
Potassium: 3.6 mEq/L (ref 3.5–5.1)
Sodium: 136 mEq/L (ref 135–145)

## 2011-03-31 LAB — CBC
HCT: 46 % (ref 39.0–52.0)
MCV: 92.8 fL (ref 78.0–100.0)
Platelets: 265 10*3/uL (ref 150–400)
RBC: 4.96 MIL/uL (ref 4.22–5.81)
WBC: 7.7 10*3/uL (ref 4.0–10.5)

## 2011-05-12 ENCOUNTER — Encounter: Payer: Self-pay | Admitting: Internal Medicine

## 2011-05-12 ENCOUNTER — Ambulatory Visit (INDEPENDENT_AMBULATORY_CARE_PROVIDER_SITE_OTHER): Payer: 59 | Admitting: Internal Medicine

## 2011-05-12 VITALS — BP 132/98 | HR 72 | Temp 98.7°F | Resp 16 | Wt 242.0 lb

## 2011-05-12 DIAGNOSIS — R42 Dizziness and giddiness: Secondary | ICD-10-CM

## 2011-05-12 DIAGNOSIS — M25562 Pain in left knee: Secondary | ICD-10-CM | POA: Insufficient documentation

## 2011-05-12 DIAGNOSIS — I10 Essential (primary) hypertension: Secondary | ICD-10-CM

## 2011-05-12 DIAGNOSIS — M25561 Pain in right knee: Secondary | ICD-10-CM | POA: Insufficient documentation

## 2011-05-12 DIAGNOSIS — M25569 Pain in unspecified knee: Secondary | ICD-10-CM

## 2011-05-12 DIAGNOSIS — K219 Gastro-esophageal reflux disease without esophagitis: Secondary | ICD-10-CM

## 2011-05-12 MED ORDER — MECLIZINE HCL 12.5 MG PO TABS: 12.5000 mg | ORAL_TABLET | Freq: Three times a day (TID) | ORAL | Status: DC | PRN

## 2011-05-12 MED ORDER — VALSARTAN 160 MG PO TABS: 160.0000 mg | ORAL_TABLET | Freq: Every day | ORAL | Status: DC

## 2011-05-12 NOTE — Patient Instructions (Signed)
Normal BP<130/85  BP Readings from Last 3 Encounters:  05/12/11 132/98  03/10/11 128/88  11/04/10 140/90    Wt Readings from Last 3 Encounters:  05/12/11 242 lb (109.77 kg)  03/10/11 240 lb (108.863 kg)  11/04/10 240 lb (108.863 kg)

## 2011-05-12 NOTE — Assessment & Plan Note (Signed)
Ortho consult

## 2011-05-12 NOTE — Assessment & Plan Note (Signed)
Prn meds 

## 2011-05-12 NOTE — Assessment & Plan Note (Signed)
BP Readings from Last 3 Encounters:  05/12/11 132/98  03/10/11 128/88  11/04/10 140/90

## 2011-05-12 NOTE — Progress Notes (Signed)
  Subjective:    Patient ID: Jerry Cohen, male    DOB: 1956/05/17, 55 y.o.   MRN: [id number]  HPI  He is here for a f/u - still dizzy x 6 mo - has to use Meclizine bid+ F/u OA and elevated BP  Review of Systems  Constitutional: Negative for appetite change, fatigue and unexpected weight change.  HENT: Negative for nosebleeds, congestion, sore throat, sneezing, trouble swallowing and neck pain.   Eyes: Negative for itching and visual disturbance.  Respiratory: Negative for cough.   Cardiovascular: Negative for chest pain, palpitations and leg swelling.  Gastrointestinal: Negative for nausea, diarrhea, blood in stool and abdominal distention.  Genitourinary: Negative for frequency and hematuria.  Musculoskeletal: Positive for arthralgias (knees) and gait problem. Negative for back pain and joint swelling.  Skin: Negative for rash.  Neurological: Positive for dizziness. Negative for tremors, speech difficulty and weakness.  Psychiatric/Behavioral: Negative for sleep disturbance, dysphoric mood and agitation. The patient is not nervous/anxious.        Objective:   Physical Exam  Constitutional: He is oriented to person, place, and time. He appears well-developed.       Obese  HENT:  Mouth/Throat: Oropharynx is clear and moist.  Eyes: Conjunctivae are normal. Pupils are equal, round, and reactive to light.  Neck: Normal range of motion. No JVD present. No thyromegaly present.  Cardiovascular: Normal rate, regular rhythm, normal heart sounds and intact distal pulses.  Exam reveals no gallop and no friction rub.   No murmur heard. Pulmonary/Chest: Effort normal and breath sounds normal. No respiratory distress. He has no wheezes. He has no rales. He exhibits no tenderness.  Abdominal: Soft. Bowel sounds are normal. He exhibits no distension and no mass. There is no tenderness. There is no rebound and no guarding.  Musculoskeletal: Normal range of motion. He exhibits no edema and no  tenderness.  Lymphadenopathy:    He has no cervical adenopathy.  Neurological: He is alert and oriented to person, place, and time. He has normal reflexes. No cranial nerve deficit. He exhibits normal muscle tone. Coordination normal.  Skin: Skin is warm and dry. No rash noted.  Psychiatric: He has a normal mood and affect. His behavior is normal. Judgment and thought content normal.    B knees NT      Assessment & Plan:

## 2011-05-12 NOTE — Assessment & Plan Note (Signed)
He is here for a f/u - still dizzy x 6 mo - has to use Meclizine bid+ ENT consult

## 2011-06-02 ENCOUNTER — Other Ambulatory Visit: Payer: Self-pay | Admitting: Internal Medicine

## 2011-08-02 ENCOUNTER — Encounter: Payer: Self-pay | Admitting: Gastroenterology

## 2011-08-22 ENCOUNTER — Encounter: Payer: Self-pay | Admitting: Gastroenterology

## 2011-09-08 ENCOUNTER — Encounter: Payer: Self-pay | Admitting: Internal Medicine

## 2011-09-08 ENCOUNTER — Ambulatory Visit (INDEPENDENT_AMBULATORY_CARE_PROVIDER_SITE_OTHER): Payer: 59 | Admitting: Internal Medicine

## 2011-09-08 ENCOUNTER — Telehealth: Payer: Self-pay | Admitting: Internal Medicine

## 2011-09-08 ENCOUNTER — Other Ambulatory Visit (INDEPENDENT_AMBULATORY_CARE_PROVIDER_SITE_OTHER): Payer: 59

## 2011-09-08 VITALS — BP 140/100 | HR 80 | Temp 98.4°F | Resp 16 | Wt 247.0 lb

## 2011-09-08 DIAGNOSIS — E785 Hyperlipidemia, unspecified: Secondary | ICD-10-CM

## 2011-09-08 DIAGNOSIS — I10 Essential (primary) hypertension: Secondary | ICD-10-CM

## 2011-09-08 DIAGNOSIS — M2550 Pain in unspecified joint: Secondary | ICD-10-CM

## 2011-09-08 DIAGNOSIS — R42 Dizziness and giddiness: Secondary | ICD-10-CM

## 2011-09-08 DIAGNOSIS — R110 Nausea: Secondary | ICD-10-CM

## 2011-09-08 LAB — CBC WITH DIFFERENTIAL/PLATELET
Basophils Absolute: 0.1 K/uL (ref 0.0–0.1)
Basophils Relative: 0.6 % (ref 0.0–3.0)
Eosinophils Absolute: 0.2 K/uL (ref 0.0–0.7)
Eosinophils Relative: 1.8 % (ref 0.0–5.0)
HCT: 46.5 % (ref 39.0–52.0)
Hemoglobin: 15.6 g/dL (ref 13.0–17.0)
Lymphocytes Relative: 20.9 % (ref 12.0–46.0)
Lymphs Abs: 2.2 K/uL (ref 0.7–4.0)
MCHC: 33.5 g/dL (ref 30.0–36.0)
MCV: 93.9 fl (ref 78.0–100.0)
Monocytes Absolute: 0.8 K/uL (ref 0.1–1.0)
Monocytes Relative: 7.4 % (ref 3.0–12.0)
Neutro Abs: 7.3 K/uL (ref 1.4–7.7)
Neutrophils Relative %: 69.3 % (ref 43.0–77.0)
Platelets: 273 K/uL (ref 150.0–400.0)
RBC: 4.95 Mil/uL (ref 4.22–5.81)
RDW: 13.4 % (ref 11.5–14.6)
WBC: 10.5 K/uL (ref 4.5–10.5)

## 2011-09-08 LAB — LIPASE: Lipase: 46 U/L (ref 11.0–59.0)

## 2011-09-08 LAB — BASIC METABOLIC PANEL
CO2: 27 mEq/L (ref 19–32)
Calcium: 9.3 mg/dL (ref 8.4–10.5)
Chloride: 104 mEq/L (ref 96–112)
Glucose, Bld: 105 mg/dL — ABNORMAL HIGH (ref 70–99)
Sodium: 138 mEq/L (ref 135–145)

## 2011-09-08 LAB — HEPATIC FUNCTION PANEL
ALT: 51 U/L (ref 0–53)
Albumin: 4 g/dL (ref 3.5–5.2)
Alkaline Phosphatase: 59 U/L (ref 39–117)
Bilirubin, Direct: 0.2 mg/dL (ref 0.0–0.3)
Total Protein: 7.5 g/dL (ref 6.0–8.3)

## 2011-09-08 LAB — H. PYLORI ANTIBODY, IGG: H Pylori IgG: NEGATIVE

## 2011-09-08 LAB — TSH: TSH: 0.78 u[IU]/mL (ref 0.35–5.50)

## 2011-09-08 MED ORDER — ROPINIROLE HCL 1 MG PO TABS: 1.0000 mg | ORAL_TABLET | Freq: Every day | ORAL | Status: DC

## 2011-09-08 MED ORDER — VALSARTAN 160 MG PO TABS: 160.0000 mg | ORAL_TABLET | Freq: Every day | ORAL | Status: DC

## 2011-09-08 MED ORDER — MECLIZINE HCL 12.5 MG PO TABS: 12.5000 mg | ORAL_TABLET | Freq: Three times a day (TID) | ORAL | Status: DC | PRN

## 2011-09-08 MED ORDER — ESOMEPRAZOLE MAGNESIUM 40 MG PO CPDR: 40.0000 mg | DELAYED_RELEASE_CAPSULE | Freq: Every day | ORAL | Status: DC

## 2011-09-08 NOTE — Progress Notes (Signed)
Patient ID: Jerry Cohen, male   DOB: 01-Mar-1956, 56 y.o.   MRN: [id number]  Subjective:    Patient ID: Jerry Cohen, male    DOB: [birth year], 56 y.o.   MRN: [id number]  HPI  He is here for a f/u - not dizzy now- has to use Meclizine bid+ F/u OA and elevated BP C/o nausea, better w/eating x a few months off and on  Wt Readings from Last 3 Encounters:  09/08/11 247 lb (112.038 kg)  05/12/11 242 lb (109.77 kg)  03/10/11 240 lb (108.863 kg)   BP Readings from Last 3 Encounters:  09/08/11 140/100  05/12/11 132/98  03/10/11 128/88     Review of Systems  Constitutional: Negative for appetite change, fatigue and unexpected weight change.  HENT: Negative for nosebleeds, congestion, sore throat, sneezing, trouble swallowing and neck pain.   Eyes: Negative for itching and visual disturbance.  Respiratory: Negative for cough.   Cardiovascular: Negative for chest pain, palpitations and leg swelling.  Gastrointestinal: Negative for nausea, diarrhea, blood in stool and abdominal distention.  Genitourinary: Negative for frequency and hematuria.  Musculoskeletal: Positive for arthralgias (knees) and gait problem. Negative for back pain and joint swelling.  Skin: Negative for rash.  Neurological: Positive for dizziness. Negative for tremors, speech difficulty and weakness.  Psychiatric/Behavioral: Negative for sleep disturbance, dysphoric mood and agitation. The patient is not nervous/anxious.        Objective:   Physical Exam  Constitutional: He is oriented to person, place, and time. He appears well-developed.       Obese  HENT:  Mouth/Throat: Oropharynx is clear and moist.  Eyes: Conjunctivae are normal. Pupils are equal, round, and reactive to light.  Neck: Normal range of motion. No JVD present. No thyromegaly present.  Cardiovascular: Normal rate, regular rhythm, normal heart sounds and intact distal pulses.  Exam reveals no gallop and no friction rub.   No murmur  heard. Pulmonary/Chest: Effort normal and breath sounds normal. No respiratory distress. He has no wheezes. He has no rales. He exhibits no tenderness.  Abdominal: Soft. Bowel sounds are normal. He exhibits no distension and no mass. There is no tenderness. There is no rebound and no guarding.  Musculoskeletal: Normal range of motion. He exhibits no edema and no tenderness.  Lymphadenopathy:    He has no cervical adenopathy.  Neurological: He is alert and oriented to person, place, and time. He has normal reflexes. No cranial nerve deficit. He exhibits normal muscle tone. Coordination normal.  Skin: Skin is warm and dry. No rash noted.  Psychiatric: He has a normal mood and affect. His behavior is normal. Judgment and thought content normal.    B knees NT  Lab Results  Component Value Date   WBC 7.7 03/10/2011   HGB 14.4 03/10/2011   HCT 42.8 03/10/2011   PLT 273.0 03/10/2011   GLUCOSE 98 03/10/2011   CHOL 225* 03/10/2011   TRIG 147.0 03/10/2011   HDL 33.10* 03/10/2011   LDLDIRECT 167.1 03/10/2011   LDLCALC 115* 06/11/2007   ALT 64* 03/10/2011   AST 38* 03/10/2011   NA 138 03/10/2011   K 4.0 03/10/2011   CL 104 03/10/2011   CREATININE 0.8 03/10/2011   BUN 14 03/10/2011   CO2 27 03/10/2011   TSH 0.84 03/10/2011   PSA 0.31 03/10/2011   HGBA1C 5.7 09/06/2007        Assessment & Plan:

## 2011-09-08 NOTE — Telephone Encounter (Signed)
Jerry Cohen, please, inform patient that all labs are OK Plan - as we discussed Thc

## 2011-09-08 NOTE — Assessment & Plan Note (Addendum)
On diet - never took diovan

## 2011-09-08 NOTE — Assessment & Plan Note (Signed)
On fish oil 

## 2011-09-08 NOTE — Assessment & Plan Note (Signed)
Hold Nproxen

## 2011-09-08 NOTE — Assessment & Plan Note (Signed)
Chronic - better 

## 2011-09-08 NOTE — Assessment & Plan Note (Signed)
D/c naproxen Nexium Labs Poss PUD [place] if needed

## 2011-09-08 NOTE — Patient Instructions (Addendum)
Normal BP<130/85 Stop Naproxen

## 2011-09-10 ENCOUNTER — Encounter: Payer: Self-pay | Admitting: Internal Medicine

## 2011-09-11 NOTE — Telephone Encounter (Signed)
Pt's wife Leslie informed. 

## 2011-09-15 ENCOUNTER — Encounter: Payer: Self-pay | Admitting: Gastroenterology

## 2011-09-15 ENCOUNTER — Ambulatory Visit (AMBULATORY_SURGERY_CENTER): Payer: 59 | Admitting: *Deleted

## 2011-09-15 ENCOUNTER — Telehealth: Payer: Self-pay | Admitting: *Deleted

## 2011-09-15 VITALS — Ht 66.0 in | Wt 245.0 lb

## 2011-09-15 DIAGNOSIS — Z800 Family history of malignant neoplasm of digestive organs: Secondary | ICD-10-CM

## 2011-09-15 DIAGNOSIS — Z1211 Encounter for screening for malignant neoplasm of colon: Secondary | ICD-10-CM

## 2011-09-15 MED ORDER — PEG-KCL-NACL-NASULF-NA ASC-C 100 G PO SOLR: ORAL | Status: DC

## 2011-09-15 NOTE — Telephone Encounter (Signed)
Yes please schedule EGD with Savary dilation with COLON

## 2011-09-15 NOTE — Telephone Encounter (Signed)
Patient here for previsit,having recall colonoscopy on 10/27/11 at 2:00 pm for strong family history colon cancer(father & 2 sisters & niece). Last EGD was performed on 05/26/03, showed Esophagitis and Esophageal Stricture. Patient states "food gets stuck at times", denies any reflux symptoms is taking Nexium daily. Patient wants to know if he should have EGD at time of colonoscopy? Please advise,thanks

## 2011-09-15 NOTE — Telephone Encounter (Signed)
Noted. Scheduled patient for Colon & EGD at the previous time. Notified patient's wife. Sherren Kerns

## 2011-09-29 ENCOUNTER — Other Ambulatory Visit: Payer: 59 | Admitting: Gastroenterology

## 2011-10-02 ENCOUNTER — Telehealth: Payer: Self-pay | Admitting: *Deleted

## 2011-10-02 NOTE — Telephone Encounter (Signed)
Called to initate PA for Nexium- spoke to a rep who states NO PA is needed. The pt last filled # 30 on 09-18-11. He can fill again on 4/18 or 4/19. Pharmacy informed.

## 2011-10-27 ENCOUNTER — Ambulatory Visit (AMBULATORY_SURGERY_CENTER): Payer: 59 | Admitting: Gastroenterology

## 2011-10-27 ENCOUNTER — Encounter: Payer: Self-pay | Admitting: Gastroenterology

## 2011-10-27 ENCOUNTER — Other Ambulatory Visit: Payer: 59 | Admitting: Gastroenterology

## 2011-10-27 VITALS — BP 142/86 | HR 56 | Temp 98.3°F | Resp 18 | Ht 66.0 in | Wt 245.0 lb

## 2011-10-27 DIAGNOSIS — D126 Benign neoplasm of colon, unspecified: Secondary | ICD-10-CM

## 2011-10-27 DIAGNOSIS — K222 Esophageal obstruction: Secondary | ICD-10-CM

## 2011-10-27 DIAGNOSIS — Z86010 Personal history of colonic polyps: Secondary | ICD-10-CM

## 2011-10-27 DIAGNOSIS — K219 Gastro-esophageal reflux disease without esophagitis: Secondary | ICD-10-CM

## 2011-10-27 DIAGNOSIS — R1310 Dysphagia, unspecified: Secondary | ICD-10-CM

## 2011-10-27 DIAGNOSIS — Z1211 Encounter for screening for malignant neoplasm of colon: Secondary | ICD-10-CM

## 2011-10-27 DIAGNOSIS — Z800 Family history of malignant neoplasm of digestive organs: Secondary | ICD-10-CM

## 2011-10-27 MED ORDER — SODIUM CHLORIDE 0.9 % IV SOLN: 500.0000 mL | INTRAVENOUS | Status: DC

## 2011-10-27 MED ORDER — OMEPRAZOLE 20 MG PO CPDR: 20.0000 mg | DELAYED_RELEASE_CAPSULE | Freq: Every day | ORAL | Status: DC

## 2011-10-27 NOTE — Op Note (Addendum)
[facility] [address] [place], [place]  [address]  ENDOSCOPY PROCEDURE REPORT PATIENT:  Jerry Cohen, Jerry Cohen  MR#:  [id number] BIRTHDATE:  25-Oct-1955, 55 yrs. old  GENDER:  male ENDOSCOPIST:  Judie Petit T. Russella Dar, MD, [facility]  PROCEDURE DATE:  10/27/2011 PROCEDURE:  EGD with dilatation over guidewire, with biopsy ASA CLASS:  Class II INDICATIONS:  dysphagia MEDICATIONS:  There was residual sedation effect present from prior procedure, These medications were titrated to patient response per physician's verbal order, Fentanyl 25 mcg IV, Versed 2 mg IV TOPICAL ANESTHETIC:  Cetacaine Spray DESCRIPTION OF PROCEDURE:   After the risks benefits and alternatives of the procedure were thoroughly explained, informed consent was obtained.  The LB GIF-H180 [id number] endoscope was introduced through the mouth and advanced to the second portion of the duodenum, without limitations.  The instrument was slowly withdrawn as the mucosa was fully examined. <<PROCEDUREIMAGES>> Esophagitis was found in the distal esophagus. It was erythematous.  Otherwise the examination was normal. A stricture was found at the gastroesophageal junction. It was circumferential and benign appearing. It was 13 mm in diameter. Biopsies were obtained and sent to pathology. Savary / guidewire: 14 mm, 15 mm and 16 mm  all with mininal resistance and no heme. The stomach was entered and closely examined. The pylorus, antrum, angularis, and lesser curvature were well visualized, including a retroflexed view of the cardia and fundus. The stomach wall was normally distensable. The scope passed easily through the pylorus into the duodenum.  Duodenitis was found in the bulb of the duodenum. It was erythematous. Otherwise normal duodenum.    Retroflexed views revealed a hiatal hernia, small.  The scope was then withdrawn from the patient and the procedure completed.  COMPLICATIONS:  None  ENDOSCOPIC IMPRESSION: 1)  Esophagitis 2) Stricture at the gastroesophageal junction 3) Duodenitis in the bulb of duodenum 4) Small hiatal hernia  RECOMMENDATIONS: 1) Anti-reflux regimen 2) Await pathology results 3) PPI qam: omeprazole 20 mg po qam, #30, 11 refills 4) post dilation instructions  Constantine Ruddick T. Russella Dar, MD, [facility]  n. REVISED:  10/30/2011 08:54 AM eSIGNED:   Venita Lick. Tunisha Ruland at 10/30/2011 08:54 AM  Jolyn Lent, [phone]

## 2011-10-27 NOTE — Patient Instructions (Signed)
Discharge instructions given with verbal understanding. Handouts on polyps,diverticulosis,hemorrhoids,stricture,hiatal hernia, and a dilatation diet given. Resume previous medications.YOU HAD AN ENDOSCOPIC PROCEDURE TODAY AT THE [facility]: Refer to the procedure report that was given to you for any specific questions about what was found during the examination.  If the procedure report does not answer your questions, please call your gastroenterologist to clarify.  If you requested that your care partner not be given the details of your procedure findings, then the procedure report has been included in a sealed envelope for you to review at your convenience later.  YOU SHOULD EXPECT: Some feelings of bloating in the abdomen. Passage of more gas than usual.  Walking can help get rid of the air that was put into your GI tract during the procedure and reduce the bloating. If you had a lower endoscopy (such as a colonoscopy or flexible sigmoidoscopy) you may notice spotting of blood in your stool or on the toilet paper. If you underwent a bowel prep for your procedure, then you may not have a normal bowel movement for a few days.  DIET: Your first meal following the procedure should be a light meal and then it is ok to progress to your normal diet.  A half-sandwich or bowl of soup is an example of a good first meal.  Heavy or fried foods are harder to digest and may make you feel nauseous or bloated.  Likewise meals heavy in dairy and vegetables can cause extra gas to form and this can also increase the bloating.  Drink plenty of fluids but you should avoid alcoholic beverages for 24 hours.  ACTIVITY: Your care partner should take you home directly after the procedure.  You should plan to take it easy, moving slowly for the rest of the day.  You can resume normal activity the day after the procedure however you should NOT DRIVE or use heavy machinery for 24 hours (because of the sedation medicines  used during the test).    SYMPTOMS TO REPORT IMMEDIATELY: A gastroenterologist can be reached at any hour.  During normal business hours, 8:30 AM to 5:00 PM Monday through Friday, call [phone].  After hours and on weekends, please call the GI answering service at [phone] who will take a message and have the physician on call contact you.   Following lower endoscopy (colonoscopy or flexible sigmoidoscopy):  Excessive amounts of blood in the stool  Significant tenderness or worsening of abdominal pains  Swelling of the abdomen that is new, acute  Fever of 100F or higher  Following upper endoscopy (EGD)  Vomiting of blood or coffee ground material  New chest pain or pain under the shoulder blades  Painful or persistently difficult swallowing  New shortness of breath  Fever of 100F or higher  Black, tarry-looking stools  FOLLOW UP: If any biopsies were taken you will be contacted by phone or by letter within the next 1-3 weeks.  Call your gastroenterologist if you have not heard about the biopsies in 3 weeks.  Our staff will call the home number listed on your records the next business day following your procedure to check on you and address any questions or concerns that you may have at that time regarding the information given to you following your procedure. This is a courtesy call and so if there is no answer at the home number and we have not heard from you through the emergency physician on call, we will assume  that you have returned to your regular daily activities without incident.  SIGNATURES/CONFIDENTIALITY: You and/or your care partner have signed paperwork which will be entered into your electronic medical record.  These signatures attest to the fact that that the information above on your After Visit Summary has been reviewed and is understood.  Full responsibility of the confidentiality of this discharge information lies with you and/or your care-partner.

## 2011-10-27 NOTE — Op Note (Signed)
[facility] [address] [place], [place]  [address]  COLONOSCOPY PROCEDURE REPORT  PATIENT:  Jerry Cohen, Jerry Cohen  MR#:  [id number] BIRTHDATE:  12-12-1955, 55 yrs. old  GENDER:  male ENDOSCOPIST:  Judie Petit T. Russella Dar, MD, [facility]  PROCEDURE DATE:  10/27/2011 PROCEDURE:  Colonoscopy with snare polypectomy ASA CLASS:  Class II INDICATIONS:  1) surveillance and high-risk screening  2) history of pre-cancerous (adenomatous) colon polyps: 2004 3) family history of colon cancer: father, 39, two sister in their 66s and a neice at 84. MEDICATIONS:   These medications were titrated to patient response per physician's verbal order, Fentanyl 50 mcg IV, Versed 5 mg IV DESCRIPTION OF PROCEDURE:   After the risks benefits and alternatives of the procedure were thoroughly explained, informed consent was obtained.  Digital rectal exam was performed and revealed no abnormalities.   The LB [id number]-[id number] endoscope was introduced through the anus and advanced to the cecum, which was identified by both the appendix and ileocecal valve, without limitations.  The quality of the prep was good, using MoviPrep. The instrument was then slowly withdrawn as the colon was fully examined. <<PROCEDUREIMAGES>> FINDINGS:  Scattered diverticula were found in the proximal transverse colon.  A sessile polyp was found in the mid transverse colon. It was 6 mm in size. Polyp was snared without cautery. Retrieval was successful. A sessile polyp was found in the descending colon. It was 5 mm in size. Polyp was snared without cautery. Retrieval was successful. A sessile polyp was found in the sigmoid colon. It was 6 mm in size. Polyp was snared without cautery. Retrieval was successful. Mild diverticulosis was found in the sigmoid colon.  Otherwise normal colonoscopy without other polyps, masses, vascular ectasias, or inflammatory changes. Retroflexed views in the rectum revealed internal hemorrhoids, small. The  time to cecum =  2.5  minutes. The scope was then withdrawn (time =  9.25  min) from the patient and the procedure completed.  COMPLICATIONS:  None  ENDOSCOPIC IMPRESSION: 1) Diverticula, scattered in the proximal transverse colon 2) 6 mm sessile polyp in the mid transverse colon 3) 5 mm sessile polyp in the descending colon 4) 6 mm sessile polyp in the sigmoid colon 5) Mild diverticulosis in the sigmoid colon 6) Internal hemorrhoids  RECOMMENDATIONS: 1) Await pathology results 2) High fiber diet with liberal fluid intake 3) Repeat Colonoscopy in 3 years  Malcolm T. Russella Dar, MD, [facility]  n. eSIGNED:   Venita Lick. Stark at 10/27/2011 02:26 PM  Jolyn Lent, [id number]

## 2011-10-30 ENCOUNTER — Telehealth: Payer: Self-pay | Admitting: *Deleted

## 2011-10-30 NOTE — Telephone Encounter (Signed)
  Follow up Call-  Call back number 10/27/2011  Post procedure Call Back phone  # [phone]  Permission to leave phone message Yes     Patient questions:  Do you have a fever, pain , or abdominal swelling? no Pain Score  0 *  Have you tolerated food without any problems? yes  Have you been able to return to your normal activities? yes  Do you have any questions about your discharge instructions: Diet   no Medications  no Follow up visit  no  Do you have questions or concerns about your Care? no  Actions: * If pain score is 4 or above: No action needed, pain <4.

## 2011-10-31 ENCOUNTER — Encounter: Payer: Self-pay | Admitting: Gastroenterology

## 2011-11-10 ENCOUNTER — Encounter: Payer: Self-pay | Admitting: Internal Medicine

## 2011-11-10 ENCOUNTER — Ambulatory Visit (INDEPENDENT_AMBULATORY_CARE_PROVIDER_SITE_OTHER): Payer: 59 | Admitting: Internal Medicine

## 2011-11-10 VITALS — BP 158/98 | HR 88 | Temp 98.3°F | Resp 16 | Wt 246.0 lb

## 2011-11-10 DIAGNOSIS — E785 Hyperlipidemia, unspecified: Secondary | ICD-10-CM

## 2011-11-10 DIAGNOSIS — G4733 Obstructive sleep apnea (adult) (pediatric): Secondary | ICD-10-CM

## 2011-11-10 DIAGNOSIS — I10 Essential (primary) hypertension: Secondary | ICD-10-CM

## 2011-11-10 DIAGNOSIS — R42 Dizziness and giddiness: Secondary | ICD-10-CM

## 2011-11-10 MED ORDER — OLMESARTAN-AMLODIPINE-HCTZ 40-5-12.5 MG PO TABS: 1.0000 | ORAL_TABLET | ORAL | Status: DC

## 2011-11-10 NOTE — Assessment & Plan Note (Signed)
Much better - almost gone

## 2011-11-10 NOTE — Assessment & Plan Note (Signed)
Start Tribenzor 

## 2011-11-10 NOTE — Assessment & Plan Note (Signed)
On CPAP. ?

## 2011-11-10 NOTE — Assessment & Plan Note (Signed)
On fish oil 

## 2011-11-10 NOTE — Progress Notes (Signed)
Patient ID: Jerry Cohen, male   DOB: Nov 23, 1955, 56 y.o.   MRN: [id number] Patient ID: Jerry Cohen, male   DOB: [birth year], 56 y.o.   MRN: [id number]  Subjective:    Patient ID: Jerry Cohen, male    DOB: 01-04-1956, 56 y.o.   MRN: [id number]  HPI  He is here for a f/u - not dizzy now- has to use Meclizine qd F/u OA and elevated BP F/u on nausea - resolved  Wt Readings from Last 3 Encounters:  11/10/11 246 lb (111.585 kg)  10/27/11 245 lb (111.131 kg)  09/15/11 245 lb (111.131 kg)   BP Readings from Last 3 Encounters:  11/10/11 158/98  10/27/11 142/86  09/08/11 140/100     Review of Systems  Constitutional: Negative for appetite change, fatigue and unexpected weight change.  HENT: Negative for nosebleeds, congestion, sore throat, sneezing, trouble swallowing and neck pain.   Eyes: Negative for itching and visual disturbance.  Respiratory: Negative for cough.   Cardiovascular: Negative for chest pain, palpitations and leg swelling.  Gastrointestinal: Negative for nausea, diarrhea, blood in stool and abdominal distention.  Genitourinary: Negative for frequency and hematuria.  Musculoskeletal: Positive for arthralgias (knees) and gait problem. Negative for back pain and joint swelling.  Skin: Negative for rash.  Neurological: Positive for dizziness. Negative for tremors, speech difficulty and weakness.  Psychiatric/Behavioral: Negative for sleep disturbance, dysphoric mood and agitation. The patient is not nervous/anxious.        Objective:   Physical Exam  Constitutional: He is oriented to person, place, and time. He appears well-developed.       Obese  HENT:  Mouth/Throat: Oropharynx is clear and moist.  Eyes: Conjunctivae are normal. Pupils are equal, round, and reactive to light.  Neck: Normal range of motion. No JVD present. No thyromegaly present.  Cardiovascular: Normal rate, regular rhythm, normal heart sounds and intact distal pulses.  Exam reveals no gallop  and no friction rub.   No murmur heard. Pulmonary/Chest: Effort normal and breath sounds normal. No respiratory distress. He has no wheezes. He has no rales. He exhibits no tenderness.  Abdominal: Soft. Bowel sounds are normal. He exhibits no distension and no mass. There is no tenderness. There is no rebound and no guarding.  Musculoskeletal: Normal range of motion. He exhibits no edema and no tenderness.  Lymphadenopathy:    He has no cervical adenopathy.  Neurological: He is alert and oriented to person, place, and time. He has normal reflexes. No cranial nerve deficit. He exhibits normal muscle tone. Coordination normal.  Skin: Skin is warm and dry. No rash noted.  Psychiatric: He has a normal mood and affect. His behavior is normal. Judgment and thought content normal.    B knees NT  Lab Results  Component Value Date   WBC 10.5 09/08/2011   HGB 15.6 09/08/2011   HCT 46.5 09/08/2011   PLT 273.0 09/08/2011   GLUCOSE 105* 09/08/2011   CHOL 225* 03/10/2011   TRIG 147.0 03/10/2011   HDL 33.10* 03/10/2011   LDLDIRECT 167.1 03/10/2011   LDLCALC 115* 06/11/2007   ALT 51 09/08/2011   AST 27 09/08/2011   NA 138 09/08/2011   K 4.1 09/08/2011   CL 104 09/08/2011   CREATININE 0.9 09/08/2011   BUN 17 09/08/2011   CO2 27 09/08/2011   TSH 0.78 09/08/2011   PSA 0.31 03/10/2011   HGBA1C 5.7 09/06/2007        Assessment & Plan:

## 2011-11-10 NOTE — Patient Instructions (Signed)
BP Readings from Last 3 Encounters:  11/10/11 158/98  10/27/11 142/86  09/08/11 140/100   Wt Readings from Last 3 Encounters:  11/10/11 246 lb (111.585 kg)  10/27/11 245 lb (111.131 kg)  09/15/11 245 lb (111.131 kg)

## 2011-11-21 ENCOUNTER — Telehealth: Payer: Self-pay | Admitting: *Deleted

## 2011-11-21 DIAGNOSIS — Z125 Encounter for screening for malignant neoplasm of prostate: Secondary | ICD-10-CM

## 2011-11-21 DIAGNOSIS — Z0000 Encounter for general adult medical examination without abnormal findings: Secondary | ICD-10-CM

## 2011-11-21 NOTE — Telephone Encounter (Signed)
Message copied by Deatra James on Tue Nov 21, 2011  4:26 PM ------      Message from: COUSIN, [place] T      Created: Fri Nov 10, 2011 11:13 AM      Regarding: PHY DATE  03/15/12       THANKS

## 2011-11-21 NOTE — Telephone Encounter (Signed)
Received staff msg made cpx for sept. Need labs entered.... 11/21/11@4 :28pm/LMB

## 2011-11-21 NOTE — Telephone Encounter (Signed)
Message copied by Deatra James on Tue Nov 21, 2011  4:33 PM ------      Message from: COUSIN, [place] T      Created: Fri Nov 10, 2011 11:13 AM      Regarding: PHY DATE  03/15/12       THANKS

## 2012-01-10 ENCOUNTER — Other Ambulatory Visit: Payer: Self-pay | Admitting: Internal Medicine

## 2012-03-15 ENCOUNTER — Encounter: Payer: 59 | Admitting: Internal Medicine

## 2012-03-15 DIAGNOSIS — Z0289 Encounter for other administrative examinations: Secondary | ICD-10-CM

## 2012-03-19 ENCOUNTER — Other Ambulatory Visit: Payer: Self-pay | Admitting: Internal Medicine

## 2012-04-16 ENCOUNTER — Other Ambulatory Visit: Payer: Self-pay | Admitting: Internal Medicine

## 2012-06-07 ENCOUNTER — Other Ambulatory Visit (INDEPENDENT_AMBULATORY_CARE_PROVIDER_SITE_OTHER): Payer: 59

## 2012-06-07 DIAGNOSIS — Z0000 Encounter for general adult medical examination without abnormal findings: Secondary | ICD-10-CM

## 2012-06-07 DIAGNOSIS — Z125 Encounter for screening for malignant neoplasm of prostate: Secondary | ICD-10-CM

## 2012-06-07 LAB — BASIC METABOLIC PANEL
Calcium: 9.5 mg/dL (ref 8.4–10.5)
Chloride: 103 mEq/L (ref 96–112)
Creatinine, Ser: 1 mg/dL (ref 0.4–1.5)
GFR: 83.34 mL/min (ref >60.00)

## 2012-06-07 LAB — CBC WITH DIFFERENTIAL/PLATELET
Basophils Relative: 0.3 % (ref 0.0–3.0)
Eosinophils Relative: 1.8 % (ref 0.0–5.0)
Lymphocytes Relative: 24.9 % (ref 12.0–46.0)
MCV: 92.3 fl (ref 78.0–100.0)
Monocytes Relative: 8.1 % (ref 3.0–12.0)
Neutrophils Relative %: 64.9 % (ref 43.0–77.0)
RBC: 5.03 Mil/uL (ref 4.22–5.81)
WBC: 8.8 10*3/uL (ref 4.5–10.5)

## 2012-06-07 LAB — HEPATIC FUNCTION PANEL
AST: 45 U/L — ABNORMAL HIGH (ref 0–37)
Albumin: 4 g/dL (ref 3.5–5.2)
Total Protein: 7 g/dL (ref 6.0–8.3)

## 2012-06-07 LAB — LIPID PANEL
Cholesterol: 232 mg/dL — ABNORMAL HIGH (ref 0–200)
HDL: 28.4 mg/dL — ABNORMAL LOW (ref >39.00)
Total CHOL/HDL Ratio: 8
VLDL: 43.2 mg/dL — ABNORMAL HIGH (ref 0.0–40.0)

## 2012-06-07 LAB — URINALYSIS, ROUTINE W REFLEX MICROSCOPIC
Leukocytes, UA: NEGATIVE
Nitrite: NEGATIVE
Specific Gravity, Urine: >=1.03 (ref 1.000–1.030)
pH: 5.5 (ref 5.0–8.0)

## 2012-06-07 LAB — PSA: PSA: 0.26 ng/mL (ref 0.10–4.00)

## 2012-06-07 LAB — TSH: TSH: 0.95 u[IU]/mL (ref 0.35–5.50)

## 2012-06-10 ENCOUNTER — Encounter: Payer: Self-pay | Admitting: Internal Medicine

## 2012-06-10 ENCOUNTER — Ambulatory Visit (INDEPENDENT_AMBULATORY_CARE_PROVIDER_SITE_OTHER): Payer: 59 | Admitting: Internal Medicine

## 2012-06-10 ENCOUNTER — Telehealth: Payer: Self-pay | Admitting: Internal Medicine

## 2012-06-10 VITALS — BP 122/80 | HR 84 | Temp 97.4°F | Resp 16 | Ht 66.0 in | Wt 243.0 lb

## 2012-06-10 DIAGNOSIS — L219 Seborrheic dermatitis, unspecified: Secondary | ICD-10-CM

## 2012-06-10 DIAGNOSIS — R7309 Other abnormal glucose: Secondary | ICD-10-CM

## 2012-06-10 DIAGNOSIS — Z0000 Encounter for general adult medical examination without abnormal findings: Secondary | ICD-10-CM

## 2012-06-10 DIAGNOSIS — L600 Ingrowing nail: Secondary | ICD-10-CM

## 2012-06-10 DIAGNOSIS — E785 Hyperlipidemia, unspecified: Secondary | ICD-10-CM

## 2012-06-10 DIAGNOSIS — I10 Essential (primary) hypertension: Secondary | ICD-10-CM

## 2012-06-10 MED ORDER — CLOTRIMAZOLE-BETAMETHASONE 1-0.05 % EX CREA: TOPICAL_CREAM | Freq: Two times a day (BID) | CUTANEOUS | Status: DC

## 2012-06-10 MED ORDER — METFORMIN HCL 500 MG PO TABS: 500.0000 mg | ORAL_TABLET | Freq: Every day | ORAL | Status: DC

## 2012-06-10 NOTE — Assessment & Plan Note (Signed)
Continue with current prescription therapy as reflected on the Med list.  

## 2012-06-10 NOTE — Assessment & Plan Note (Signed)
Worse due to elev glu  Start metformin

## 2012-06-10 NOTE — Telephone Encounter (Signed)
Is he coming for wellness? Thx

## 2012-06-10 NOTE — Assessment & Plan Note (Signed)
Lotrisone bid  

## 2012-06-10 NOTE — Assessment & Plan Note (Signed)
Dr Ralene Cork

## 2012-06-10 NOTE — Progress Notes (Signed)
   Subjective:    Patient ID: Jerry Cohen, male    DOB: Jul 26, 1955, 56 y.o.   MRN: [id number]  HPI  The patient is here for a wellness exam. The patient has been doing well overall without major physical or psychological issues going on lately.   He is here for a f/u of being dizzy at times - has to use Meclizine rarely F/u OA and elevated BP  Wt Readings from Last 3 Encounters:  06/10/12 243 lb (110.224 kg)  11/10/11 246 lb (111.585 kg)  10/27/11 245 lb (111.131 kg)   BP Readings from Last 3 Encounters:  06/10/12 122/80  11/10/11 158/98  10/27/11 142/86     Review of Systems  Constitutional: Negative for appetite change, fatigue and unexpected weight change.  HENT: Negative for nosebleeds, congestion, sore throat, sneezing, trouble swallowing and neck pain.   Eyes: Negative for itching and visual disturbance.  Respiratory: Negative for cough.   Cardiovascular: Negative for chest pain, palpitations and leg swelling.  Gastrointestinal: Negative for nausea, diarrhea, blood in stool and abdominal distention.  Genitourinary: Negative for frequency and hematuria.  Musculoskeletal: Positive for arthralgias (knees) and gait problem. Negative for back pain and joint swelling.  Skin: Negative for rash.  Neurological: Positive for dizziness. Negative for tremors, speech difficulty and weakness.  Psychiatric/Behavioral: Negative for sleep disturbance, dysphoric mood and agitation. The patient is not nervous/anxious.        Objective:   Physical Exam  Constitutional: He is oriented to person, place, and time. He appears well-developed.       Obese  HENT:  Mouth/Throat: Oropharynx is clear and moist.  Eyes: Conjunctivae normal are normal. Pupils are equal, round, and reactive to light.  Neck: Normal range of motion. No JVD present. No thyromegaly present.  Cardiovascular: Normal rate, regular rhythm, normal heart sounds and intact distal pulses.  Exam reveals no gallop and no  friction rub.   No murmur heard. Pulmonary/Chest: Effort normal and breath sounds normal. No respiratory distress. He has no wheezes. He has no rales. He exhibits no tenderness.  Abdominal: Soft. Bowel sounds are normal. He exhibits no distension and no mass. There is no tenderness. There is no rebound and no guarding.  Musculoskeletal: Normal range of motion. He exhibits no edema and no tenderness.  Lymphadenopathy:    He has no cervical adenopathy.  Neurological: He is alert and oriented to person, place, and time. He has normal reflexes. No cranial nerve deficit. He exhibits normal muscle tone. Coordination normal.  Skin: Skin is warm and dry. No rash noted.  Psychiatric: He has a normal mood and affect. His behavior is normal. Judgment and thought content normal.      Lab Results  Component Value Date   WBC 8.8 06/07/2012   HGB 15.9 06/07/2012   HCT 46.4 06/07/2012   PLT 252.0 06/07/2012   GLUCOSE 156* 06/07/2012   CHOL 232* 06/07/2012   TRIG 216.0* 06/07/2012   HDL 28.40* 06/07/2012   LDLDIRECT 184.2 06/07/2012   LDLCALC 115* 06/11/2007   ALT 78* 06/07/2012   AST 45* 06/07/2012   NA 138 06/07/2012   K 4.1 06/07/2012   CL 103 06/07/2012   CREATININE 1.0 06/07/2012   BUN 14 06/07/2012   CO2 29 06/07/2012   TSH 0.95 06/07/2012   PSA 0.26 06/07/2012   HGBA1C 5.7 09/06/2007        Assessment & Plan:

## 2012-06-10 NOTE — Assessment & Plan Note (Signed)
We discussed age appropriate health related issues, including available/recomended screening tests and vaccinations. We discussed a need for adhering to healthy diet and exercise. Labs/EKG were reviewed/ordered. All questions were answered.   

## 2012-06-10 NOTE — Telephone Encounter (Signed)
He is scheduled for a 3 mo f/u in 08/2012.

## 2012-06-10 NOTE — Assessment & Plan Note (Signed)
Start Metformin Loose wt RTC 3 mo

## 2012-06-11 NOTE — Telephone Encounter (Signed)
No - it is a reg f/u Thx

## 2012-06-17 ENCOUNTER — Other Ambulatory Visit: Payer: Self-pay | Admitting: Internal Medicine

## 2012-08-06 ENCOUNTER — Encounter: Payer: Self-pay | Admitting: Internal Medicine

## 2012-09-09 ENCOUNTER — Ambulatory Visit: Payer: 59 | Admitting: Internal Medicine

## 2012-09-12 ENCOUNTER — Other Ambulatory Visit (INDEPENDENT_AMBULATORY_CARE_PROVIDER_SITE_OTHER): Payer: 59

## 2012-09-12 DIAGNOSIS — I10 Essential (primary) hypertension: Secondary | ICD-10-CM

## 2012-09-12 DIAGNOSIS — E785 Hyperlipidemia, unspecified: Secondary | ICD-10-CM

## 2012-09-12 LAB — BASIC METABOLIC PANEL
Calcium: 9.4 mg/dL (ref 8.4–10.5)
Chloride: 104 mEq/L (ref 96–112)
Creatinine, Ser: 1 mg/dL (ref 0.4–1.5)
Sodium: 140 mEq/L (ref 135–145)

## 2012-09-12 LAB — HEPATIC FUNCTION PANEL
ALT: 63 U/L — ABNORMAL HIGH (ref 0–53)
AST: 32 U/L (ref 0–37)
Albumin: 3.9 g/dL (ref 3.5–5.2)

## 2012-09-12 LAB — LIPID PANEL
Cholesterol: 225 mg/dL — ABNORMAL HIGH (ref 0–200)
HDL: 27.7 mg/dL — ABNORMAL LOW (ref >39.00)
Total CHOL/HDL Ratio: 8
Triglycerides: 140 mg/dL (ref 0.0–149.0)

## 2012-09-12 LAB — LDL CHOLESTEROL, DIRECT: Direct LDL: 175.9 mg/dL

## 2012-09-12 LAB — HEMOGLOBIN A1C: Hgb A1c MFr Bld: 5.8 % (ref 4.6–6.5)

## 2012-09-17 ENCOUNTER — Encounter: Payer: Self-pay | Admitting: Internal Medicine

## 2012-09-17 ENCOUNTER — Ambulatory Visit (INDEPENDENT_AMBULATORY_CARE_PROVIDER_SITE_OTHER): Payer: 59 | Admitting: Internal Medicine

## 2012-09-17 VITALS — BP 120/90 | HR 64 | Temp 98.3°F | Resp 16 | Wt 235.0 lb

## 2012-09-17 DIAGNOSIS — R635 Abnormal weight gain: Secondary | ICD-10-CM

## 2012-09-17 DIAGNOSIS — I10 Essential (primary) hypertension: Secondary | ICD-10-CM

## 2012-09-17 DIAGNOSIS — E785 Hyperlipidemia, unspecified: Secondary | ICD-10-CM

## 2012-09-17 DIAGNOSIS — R7309 Other abnormal glucose: Secondary | ICD-10-CM

## 2012-09-17 MED ORDER — TRIAMCINOLONE ACETONIDE 0.5 % EX CREA: TOPICAL_CREAM | Freq: Three times a day (TID) | CUTANEOUS | Status: DC

## 2012-09-17 MED ORDER — MECLIZINE HCL 12.5 MG PO TABS: 12.5000 mg | ORAL_TABLET | Freq: Three times a day (TID) | ORAL | Status: DC | PRN

## 2012-09-17 MED ORDER — ROPINIROLE HCL 1 MG PO TABS: 1.0000 mg | ORAL_TABLET | Freq: Every day | ORAL | Status: DC

## 2012-09-17 MED ORDER — ESOMEPRAZOLE MAGNESIUM 40 MG PO CPDR: 40.0000 mg | DELAYED_RELEASE_CAPSULE | Freq: Every day | ORAL | Status: DC

## 2012-09-17 MED ORDER — CLOTRIMAZOLE-BETAMETHASONE 1-0.05 % EX CREA: TOPICAL_CREAM | Freq: Two times a day (BID) | CUTANEOUS | Status: DC

## 2012-09-17 MED ORDER — OLMESARTAN-AMLODIPINE-HCTZ 40-5-12.5 MG PO TABS: 1.0000 | ORAL_TABLET | ORAL | Status: DC

## 2012-09-17 MED ORDER — METFORMIN HCL 500 MG PO TABS: 500.0000 mg | ORAL_TABLET | Freq: Every day | ORAL | Status: DC

## 2012-09-17 NOTE — Assessment & Plan Note (Signed)
Better Wt Readings from Last 3 Encounters:  09/17/12 235 lb (106.595 kg)  06/10/12 243 lb (110.224 kg)  11/10/11 246 lb (111.585 kg)

## 2012-09-17 NOTE — Assessment & Plan Note (Signed)
On fish oil 

## 2012-09-17 NOTE — Progress Notes (Signed)
   Subjective:    Pa HPI      F/u OA and elevated BP, elev glu, elev chol  Wt Readings from Last 3 Encounters:  09/17/12 235 lb (106.595 kg)  06/10/12 243 lb (110.224 kg)  11/10/11 246 lb (111.585 kg)   BP Readings from Last 3 Encounters:  09/17/12 120/90  06/10/12 122/80  11/10/11 158/98     Review of Systems  Constitutional: Negative for appetite change, fatigue and unexpected weight change.  HENT: Negative for nosebleeds, congestion, sore throat, sneezing, trouble swallowing and neck pain.   Eyes: Negative for itching and visual disturbance.  Respiratory: Negative for cough.   Cardiovascular: Negative for chest pain, palpitations and leg swelling.  Gastrointestinal: Negative for nausea, diarrhea, blood in stool and abdominal distention.  Genitourinary: Negative for frequency and hematuria.  Musculoskeletal: Positive for arthralgias (knees) and gait problem. Negative for back pain and joint swelling.  Skin: Negative for rash.  Neurological: Positive for dizziness. Negative for tremors, speech difficulty and weakness.  Psychiatric/Behavioral: Negative for sleep disturbance, dysphoric mood and agitation. The patient is not nervous/anxious.        Objective:   Physical Exam  Constitutional: He is oriented to person, place, and time. He appears well-developed.  Obese  HENT:  Mouth/Throat: Oropharynx is clear and moist.  Eyes: Conjunctivae are normal. Pupils are equal, round, and reactive to light.  Neck: Normal range of motion. No JVD present. No thyromegaly present.  Cardiovascular: Normal rate, regular rhythm, normal heart sounds and intact distal pulses.  Exam reveals no gallop and no friction rub.   No murmur heard. Pulmonary/Chest: Effort normal and breath sounds normal. No respiratory distress. He has no wheezes. He has no rales. He exhibits no tenderness.  Abdominal: Soft. Bowel sounds are normal. He exhibits no distension and no mass. There is no tenderness.  There is no rebound and no guarding.  Musculoskeletal: Normal range of motion. He exhibits no edema and no tenderness.  Lymphadenopathy:    He has no cervical adenopathy.  Neurological: He is alert and oriented to person, place, and time. He has normal reflexes. No cranial nerve deficit. He exhibits normal muscle tone. Coordination normal.  Skin: Skin is warm and dry. No rash noted.  Psychiatric: He has a normal mood and affect. His behavior is normal. Judgment and thought content normal.      Lab Results  Component Value Date   WBC 8.8 06/07/2012   HGB 15.9 06/07/2012   HCT 46.4 06/07/2012   PLT 252.0 06/07/2012   GLUCOSE 128* 09/12/2012   CHOL 225* 09/12/2012   TRIG 140.0 09/12/2012   HDL 27.70* 09/12/2012   LDLDIRECT 175.9 09/12/2012   LDLCALC 115* 06/11/2007   ALT 63* 09/12/2012   AST 32 09/12/2012   NA 140 09/12/2012   K 3.9 09/12/2012   CL 104 09/12/2012   CREATININE 1.0 09/12/2012   BUN 12 09/12/2012   CO2 29 09/12/2012   TSH 0.95 06/07/2012   PSA 0.26 06/07/2012   HGBA1C 5.8 09/12/2012        Assessment & Plan:

## 2012-09-17 NOTE — Assessment & Plan Note (Signed)
Better w/wt los

## 2012-09-17 NOTE — Assessment & Plan Note (Signed)
Continue with current prescription therapy as reflected on the Med list.  

## 2012-10-09 ENCOUNTER — Other Ambulatory Visit: Payer: Self-pay | Admitting: Internal Medicine

## 2012-11-06 ENCOUNTER — Other Ambulatory Visit: Payer: Self-pay | Admitting: *Deleted

## 2012-11-06 MED ORDER — ESOMEPRAZOLE MAGNESIUM 40 MG PO CPDR: 40.0000 mg | DELAYED_RELEASE_CAPSULE | Freq: Every day | ORAL | Status: DC

## 2012-11-07 ENCOUNTER — Other Ambulatory Visit: Payer: Self-pay | Admitting: Internal Medicine

## 2012-11-09 ENCOUNTER — Other Ambulatory Visit: Payer: Self-pay | Admitting: Internal Medicine

## 2012-12-10 ENCOUNTER — Other Ambulatory Visit: Payer: Self-pay | Admitting: Internal Medicine

## 2013-03-03 ENCOUNTER — Other Ambulatory Visit: Payer: Self-pay | Admitting: Internal Medicine

## 2013-03-20 ENCOUNTER — Other Ambulatory Visit (INDEPENDENT_AMBULATORY_CARE_PROVIDER_SITE_OTHER): Payer: 59

## 2013-03-20 DIAGNOSIS — R7309 Other abnormal glucose: Secondary | ICD-10-CM

## 2013-03-20 DIAGNOSIS — R635 Abnormal weight gain: Secondary | ICD-10-CM

## 2013-03-20 DIAGNOSIS — I10 Essential (primary) hypertension: Secondary | ICD-10-CM

## 2013-03-20 DIAGNOSIS — E785 Hyperlipidemia, unspecified: Secondary | ICD-10-CM

## 2013-03-20 LAB — BASIC METABOLIC PANEL
CO2: 29 mEq/L (ref 19–32)
Chloride: 106 mEq/L (ref 96–112)
GFR: 87.21 mL/min (ref >60.00)
Glucose, Bld: 112 mg/dL — ABNORMAL HIGH (ref 70–99)
Potassium: 3.6 mEq/L (ref 3.5–5.1)
Sodium: 141 mEq/L (ref 135–145)

## 2013-03-20 LAB — LIPID PANEL
HDL: 28.1 mg/dL — ABNORMAL LOW (ref >39.00)
Total CHOL/HDL Ratio: 8
Triglycerides: 252 mg/dL — ABNORMAL HIGH (ref 0.0–149.0)
VLDL: 50.4 mg/dL — ABNORMAL HIGH (ref 0.0–40.0)

## 2013-03-20 LAB — HEPATIC FUNCTION PANEL
ALT: 63 U/L — ABNORMAL HIGH (ref 0–53)
Bilirubin, Direct: 0.2 mg/dL (ref 0.0–0.3)
Total Bilirubin: 1.3 mg/dL — ABNORMAL HIGH (ref 0.3–1.2)

## 2013-03-20 LAB — HEMOGLOBIN A1C: Hgb A1c MFr Bld: 6 % (ref 4.6–6.5)

## 2013-03-20 LAB — LDL CHOLESTEROL, DIRECT: Direct LDL: 154.1 mg/dL

## 2013-03-21 ENCOUNTER — Encounter: Payer: Self-pay | Admitting: Internal Medicine

## 2013-03-21 ENCOUNTER — Ambulatory Visit (INDEPENDENT_AMBULATORY_CARE_PROVIDER_SITE_OTHER): Payer: 59 | Admitting: Internal Medicine

## 2013-03-21 VITALS — BP 140/90 | HR 80 | Temp 98.2°F | Resp 16 | Wt 238.0 lb

## 2013-03-21 DIAGNOSIS — G2581 Restless legs syndrome: Secondary | ICD-10-CM

## 2013-03-21 DIAGNOSIS — I10 Essential (primary) hypertension: Secondary | ICD-10-CM

## 2013-03-21 DIAGNOSIS — Z0000 Encounter for general adult medical examination without abnormal findings: Secondary | ICD-10-CM

## 2013-03-21 DIAGNOSIS — E785 Hyperlipidemia, unspecified: Secondary | ICD-10-CM

## 2013-03-21 DIAGNOSIS — M2550 Pain in unspecified joint: Secondary | ICD-10-CM

## 2013-03-21 NOTE — Assessment & Plan Note (Signed)
On requip 

## 2013-03-21 NOTE — Assessment & Plan Note (Signed)
Continue with current prescription therapy as reflected on the Med list. 2014 BP is ok at home

## 2013-03-21 NOTE — Assessment & Plan Note (Signed)
Continue with current prescription therapy as reflected on the Med list.  

## 2013-03-21 NOTE — Assessment & Plan Note (Signed)
  On diet  

## 2013-03-21 NOTE — Progress Notes (Signed)
   Subjective:    Pa HPI      F/u OA and elevated BP, elev glu, elev chol, restless legs  Wt Readings from Last 3 Encounters:  03/21/13 238 lb (107.956 kg)  09/17/12 235 lb (106.595 kg)  06/10/12 243 lb (110.224 kg)   BP Readings from Last 3 Encounters:  03/21/13 140/90  09/17/12 120/90  06/10/12 122/80     Review of Systems  Constitutional: Negative for appetite change, fatigue and unexpected weight change.  HENT: Negative for nosebleeds, congestion, sore throat, sneezing, trouble swallowing and neck pain.   Eyes: Negative for itching and visual disturbance.  Respiratory: Negative for cough.   Cardiovascular: Negative for chest pain, palpitations and leg swelling.  Gastrointestinal: Negative for nausea, diarrhea, blood in stool and abdominal distention.  Genitourinary: Negative for frequency and hematuria.  Musculoskeletal: Positive for arthralgias (knees) and gait problem. Negative for back pain and joint swelling.  Skin: Negative for rash.  Neurological: Positive for dizziness. Negative for tremors, speech difficulty and weakness.  Psychiatric/Behavioral: Negative for sleep disturbance, dysphoric mood and agitation. The patient is not nervous/anxious.        Objective:   Physical Exam  Constitutional: He is oriented to person, place, and time. He appears well-developed.  Obese  HENT:  Mouth/Throat: Oropharynx is clear and moist.  Eyes: Conjunctivae are normal. Pupils are equal, round, and reactive to light.  Neck: Normal range of motion. No JVD present. No thyromegaly present.  Cardiovascular: Normal rate, regular rhythm, normal heart sounds and intact distal pulses.  Exam reveals no gallop and no friction rub.   No murmur heard. Pulmonary/Chest: Effort normal and breath sounds normal. No respiratory distress. He has no wheezes. He has no rales. He exhibits no tenderness.  Abdominal: Soft. Bowel sounds are normal. He exhibits no distension and no mass. There is no  tenderness. There is no rebound and no guarding.  Musculoskeletal: Normal range of motion. He exhibits no edema and no tenderness.  Lymphadenopathy:    He has no cervical adenopathy.  Neurological: He is alert and oriented to person, place, and time. He has normal reflexes. No cranial nerve deficit. He exhibits normal muscle tone. Coordination normal.  Skin: Skin is warm and dry. No rash noted.  Psychiatric: He has a normal mood and affect. His behavior is normal. Judgment and thought content normal.      Lab Results  Component Value Date   WBC 8.8 06/07/2012   HGB 15.9 06/07/2012   HCT 46.4 06/07/2012   PLT 252.0 06/07/2012   GLUCOSE 112* 03/20/2013   CHOL 229* 03/20/2013   TRIG 252.0* 03/20/2013   HDL 28.10* 03/20/2013   LDLDIRECT 154.1 03/20/2013   LDLCALC 115* 06/11/2007   ALT 63* 03/20/2013   AST 34 03/20/2013   NA 141 03/20/2013   K 3.6 03/20/2013   CL 106 03/20/2013   CREATININE 0.9 03/20/2013   BUN 11 03/20/2013   CO2 29 03/20/2013   TSH 0.95 06/07/2012   PSA 0.26 06/07/2012   HGBA1C 6.0 03/20/2013        Assessment & Plan:

## 2013-06-26 HISTORY — PX: COLONOSCOPY: SHX174

## 2013-08-15 ENCOUNTER — Other Ambulatory Visit: Payer: Self-pay

## 2013-09-12 ENCOUNTER — Other Ambulatory Visit (INDEPENDENT_AMBULATORY_CARE_PROVIDER_SITE_OTHER): Payer: 59

## 2013-09-12 ENCOUNTER — Encounter: Payer: Self-pay | Admitting: Internal Medicine

## 2013-09-12 ENCOUNTER — Ambulatory Visit (INDEPENDENT_AMBULATORY_CARE_PROVIDER_SITE_OTHER): Payer: 59 | Admitting: Internal Medicine

## 2013-09-12 VITALS — BP 140/90 | HR 68 | Temp 98.5°F | Resp 16 | Ht 66.0 in | Wt 236.0 lb

## 2013-09-12 DIAGNOSIS — G2581 Restless legs syndrome: Secondary | ICD-10-CM

## 2013-09-12 DIAGNOSIS — Z0000 Encounter for general adult medical examination without abnormal findings: Secondary | ICD-10-CM

## 2013-09-12 DIAGNOSIS — M2550 Pain in unspecified joint: Secondary | ICD-10-CM

## 2013-09-12 DIAGNOSIS — E785 Hyperlipidemia, unspecified: Secondary | ICD-10-CM

## 2013-09-12 DIAGNOSIS — K219 Gastro-esophageal reflux disease without esophagitis: Secondary | ICD-10-CM

## 2013-09-12 DIAGNOSIS — I10 Essential (primary) hypertension: Secondary | ICD-10-CM

## 2013-09-12 LAB — URINALYSIS
Bilirubin Urine: NEGATIVE
HGB URINE DIPSTICK: NEGATIVE
Ketones, ur: NEGATIVE
Leukocytes, UA: NEGATIVE
NITRITE: NEGATIVE
Specific Gravity, Urine: 1.025 (ref 1.000–1.030)
Total Protein, Urine: NEGATIVE
URINE GLUCOSE: NEGATIVE
Urobilinogen, UA: 0.2 (ref 0.0–1.0)
pH: 6.5 (ref 5.0–8.0)

## 2013-09-12 LAB — HEPATIC FUNCTION PANEL
ALBUMIN: 4.1 g/dL (ref 3.5–5.2)
ALT: 74 U/L — ABNORMAL HIGH (ref 0–53)
AST: 41 U/L — ABNORMAL HIGH (ref 0–37)
Alkaline Phosphatase: 53 U/L (ref 39–117)
Bilirubin, Direct: 0.2 mg/dL (ref 0.0–0.3)
TOTAL PROTEIN: 6.8 g/dL (ref 6.0–8.3)
Total Bilirubin: 1.5 mg/dL — ABNORMAL HIGH (ref 0.3–1.2)

## 2013-09-12 LAB — CBC WITH DIFFERENTIAL/PLATELET
BASOS ABS: 0.1 10*3/uL (ref 0.0–0.1)
Basophils Relative: 0.7 % (ref 0.0–3.0)
EOS PCT: 4 % (ref 0.0–5.0)
Eosinophils Absolute: 0.3 10*3/uL (ref 0.0–0.7)
HCT: 44.7 % (ref 39.0–52.0)
Hemoglobin: 15.3 g/dL (ref 13.0–17.0)
Lymphocytes Relative: 26.4 % (ref 12.0–46.0)
Lymphs Abs: 2.1 10*3/uL (ref 0.7–4.0)
MCHC: 34.3 g/dL (ref 30.0–36.0)
MCV: 93.1 fl (ref 78.0–100.0)
MONO ABS: 0.7 10*3/uL (ref 0.1–1.0)
MONOS PCT: 9.2 % (ref 3.0–12.0)
NEUTROS PCT: 59.7 % (ref 43.0–77.0)
Neutro Abs: 4.7 10*3/uL (ref 1.4–7.7)
PLATELETS: 269 10*3/uL (ref 150.0–400.0)
RBC: 4.8 Mil/uL (ref 4.22–5.81)
RDW: 13.4 % (ref 11.5–14.6)
WBC: 7.8 10*3/uL (ref 4.5–10.5)

## 2013-09-12 LAB — BASIC METABOLIC PANEL
BUN: 13 mg/dL (ref 6–23)
CO2: 30 mEq/L (ref 19–32)
CREATININE: 0.9 mg/dL (ref 0.4–1.5)
Calcium: 9.3 mg/dL (ref 8.4–10.5)
Chloride: 106 mEq/L (ref 96–112)
GFR: 92.74 mL/min (ref >60.00)
Glucose, Bld: 103 mg/dL — ABNORMAL HIGH (ref 70–99)
Potassium: 4.1 mEq/L (ref 3.5–5.1)
Sodium: 141 mEq/L (ref 135–145)

## 2013-09-12 LAB — LIPID PANEL
CHOLESTEROL: 217 mg/dL — AB (ref 0–200)
HDL: 30.1 mg/dL — ABNORMAL LOW (ref >39.00)
LDL CALC: 165 mg/dL — AB (ref 0–99)
TRIGLYCERIDES: 112 mg/dL (ref 0.0–149.0)
Total CHOL/HDL Ratio: 7
VLDL: 22.4 mg/dL (ref 0.0–40.0)

## 2013-09-12 LAB — TSH: TSH: 0.91 u[IU]/mL (ref 0.35–5.50)

## 2013-09-12 LAB — PSA: PSA: 0.89 ng/mL (ref 0.10–4.00)

## 2013-09-12 LAB — HEMOGLOBIN A1C: Hgb A1c MFr Bld: 5.8 % (ref 4.6–6.5)

## 2013-09-12 MED ORDER — MELOXICAM 15 MG PO TABS: ORAL_TABLET | ORAL | Status: DC

## 2013-09-12 NOTE — Assessment & Plan Note (Signed)
Continue with current prescription therapy as reflected on the Med list.  

## 2013-09-12 NOTE — Progress Notes (Deleted)
Pre visit review using our clinic review tool, if applicable. No additional management support is needed unless otherwise documented below in the visit note. 

## 2013-09-12 NOTE — Assessment & Plan Note (Addendum)
We discussed age appropriate health related issues, including available/recomended screening tests and vaccinations. We discussed a need for adhering to healthy diet and exercise. Labs/EKG were reviewed/ordered. All questions were answered.  Wt Readings from Last 3 Encounters:  09/12/13 236 lb (107.049 kg)  03/21/13 238 lb (107.956 kg)  09/17/12 235 lb (106.595 kg)     Cut back on work if you can  Labs, EKG

## 2013-09-12 NOTE — Patient Instructions (Signed)
Zostavax vaccine Cologuard

## 2013-09-12 NOTE — Assessment & Plan Note (Signed)
Vit D Meloxicam prn

## 2013-09-12 NOTE — Progress Notes (Signed)
   Subjective:    HPI  The patient is here for a wellness exam. The patient has been doing well overall without major physical or psychological issues going on lately. Working 65 h/wk   He is here for a f/u of being dizzy at times - has to use Meclizine rarely F/u OA and elevated BP  Wt Readings from Last 3 Encounters:  09/12/13 236 lb (107.049 kg)  03/21/13 238 lb (107.956 kg)  09/17/12 235 lb (106.595 kg)   BP Readings from Last 3 Encounters:  09/12/13 140/90  03/21/13 140/90  09/17/12 120/90     Review of Systems  Constitutional: Negative for appetite change, fatigue and unexpected weight change.  HENT: Negative for congestion, nosebleeds, sneezing, sore throat and trouble swallowing.   Eyes: Negative for itching and visual disturbance.  Respiratory: Negative for cough.   Cardiovascular: Negative for chest pain, palpitations and leg swelling.  Gastrointestinal: Negative for nausea, diarrhea, blood in stool and abdominal distention.  Genitourinary: Negative for frequency and hematuria.  Musculoskeletal: Positive for arthralgias (knees) and gait problem. Negative for back pain, joint swelling and neck pain.  Skin: Negative for rash.  Neurological: Positive for dizziness. Negative for tremors, speech difficulty and weakness.  Psychiatric/Behavioral: Negative for sleep disturbance, dysphoric mood and agitation. The patient is not nervous/anxious.        Objective:   Physical Exam  Constitutional: He is oriented to person, place, and time. He appears well-developed.  Obese  HENT:  Mouth/Throat: Oropharynx is clear and moist.  Eyes: Conjunctivae are normal. Pupils are equal, round, and reactive to light.  Neck: Normal range of motion. No JVD present. No thyromegaly present.  Cardiovascular: Normal rate, regular rhythm, normal heart sounds and intact distal pulses.  Exam reveals no gallop and no friction rub.   No murmur heard. Pulmonary/Chest: Effort normal and breath  sounds normal. No respiratory distress. He has no wheezes. He has no rales. He exhibits no tenderness.  Abdominal: Soft. Bowel sounds are normal. He exhibits no distension and no mass. There is no tenderness. There is no rebound and no guarding.  Genitourinary: Rectum normal. Guaiac negative stool.  Musculoskeletal: Normal range of motion. He exhibits no edema and no tenderness.  Lymphadenopathy:    He has no cervical adenopathy.  Neurological: He is alert and oriented to person, place, and time. He has normal reflexes. No cranial nerve deficit. He exhibits normal muscle tone. Coordination normal.  Skin: Skin is warm and dry. No rash noted.  Psychiatric: He has a normal mood and affect. His behavior is normal. Judgment and thought content normal.  Rectal WNL Midline abd hernia   Lab Results  Component Value Date   WBC 8.8 06/07/2012   HGB 15.9 06/07/2012   HCT 46.4 06/07/2012   PLT 252.0 06/07/2012   GLUCOSE 112* 03/20/2013   CHOL 229* 03/20/2013   TRIG 252.0* 03/20/2013   HDL 28.10* 03/20/2013   LDLDIRECT 154.1 03/20/2013   LDLCALC 115* 06/11/2007   ALT 63* 03/20/2013   AST 34 03/20/2013   NA 141 03/20/2013   K 3.6 03/20/2013   CL 106 03/20/2013   CREATININE 0.9 03/20/2013   BUN 11 03/20/2013   CO2 29 03/20/2013   TSH 0.95 06/07/2012   PSA 0.26 06/07/2012   HGBA1C 6.0 03/20/2013        Assessment & Plan:

## 2013-09-15 ENCOUNTER — Other Ambulatory Visit: Payer: Self-pay | Admitting: Internal Medicine

## 2013-09-15 ENCOUNTER — Telehealth: Payer: Self-pay | Admitting: Internal Medicine

## 2013-09-15 DIAGNOSIS — R945 Abnormal results of liver function studies: Principal | ICD-10-CM

## 2013-09-15 DIAGNOSIS — R7989 Other specified abnormal findings of blood chemistry: Secondary | ICD-10-CM

## 2013-09-15 NOTE — Telephone Encounter (Signed)
Relevant patient education mailed to patient.  

## 2013-09-19 ENCOUNTER — Ambulatory Visit
Admission: RE | Admit: 2013-09-19 | Discharge: 2013-09-19 | Disposition: A | Discharge status: Home / Self Care | Payer: 59 | Source: Ambulatory Visit | Attending: Internal Medicine | Admitting: Internal Medicine

## 2013-09-19 DIAGNOSIS — R945 Abnormal results of liver function studies: Principal | ICD-10-CM

## 2013-09-19 DIAGNOSIS — R7989 Other specified abnormal findings of blood chemistry: Secondary | ICD-10-CM

## 2013-09-22 ENCOUNTER — Telehealth: Payer: Self-pay | Admitting: *Deleted

## 2013-09-22 NOTE — Telephone Encounter (Signed)
Pt called states he was to believe he was to take the Diovan BID.  That increase is not reflected in his upon review.  Please advise

## 2013-09-23 ENCOUNTER — Other Ambulatory Visit: Payer: Self-pay | Admitting: *Deleted

## 2013-09-23 MED ORDER — OLMESARTAN-AMLODIPINE-HCTZ 40-5-12.5 MG PO TABS: 1.0000 | ORAL_TABLET | ORAL | Status: DC

## 2013-09-23 NOTE — Telephone Encounter (Signed)
He was supposed to take Tribenzor 1 a day Thx

## 2013-09-24 NOTE — Telephone Encounter (Signed)
Spoke with pt advised of MDs message 

## 2013-09-24 NOTE — Telephone Encounter (Signed)
Left message for pt to return call.

## 2013-10-09 ENCOUNTER — Other Ambulatory Visit: Payer: Self-pay | Admitting: Internal Medicine

## 2014-02-16 ENCOUNTER — Other Ambulatory Visit: Payer: Self-pay

## 2014-02-16 NOTE — Telephone Encounter (Signed)
Pt's wife called and said that their insurance no longer covers tribenzor 40-5-12.5, and that the pt would need a new rx. Please advise

## 2014-02-16 NOTE — Telephone Encounter (Signed)
What do they cover to substitute? Thx

## 2014-02-17 NOTE — Telephone Encounter (Signed)
LMTCB

## 2014-02-26 ENCOUNTER — Other Ambulatory Visit: Payer: Self-pay

## 2014-02-26 MED ORDER — ROPINIROLE HCL 1 MG PO TABS: 1.0000 mg | ORAL_TABLET | Freq: Every day | ORAL | Status: DC

## 2014-03-06 ENCOUNTER — Encounter: Payer: Self-pay | Admitting: Gastroenterology

## 2014-03-06 NOTE — Telephone Encounter (Signed)
Pt's wife left vm stating Valsartan is covered.

## 2014-03-09 NOTE — Telephone Encounter (Signed)
Ok Valsartan-HCT 320-25 qd and Amlodipine 5 mg qd instead Thx

## 2014-03-10 MED ORDER — AMLODIPINE BESYLATE 5 MG PO TABS: 5.0000 mg | ORAL_TABLET | Freq: Every day | ORAL | Status: DC

## 2014-03-10 MED ORDER — VALSARTAN-HYDROCHLOROTHIAZIDE 320-25 MG PO TABS: 1.0000 | ORAL_TABLET | Freq: Every day | ORAL | Status: DC

## 2014-03-10 NOTE — Telephone Encounter (Signed)
Done. Pt's wife informed.

## 2014-03-20 ENCOUNTER — Ambulatory Visit: Payer: 59 | Admitting: Internal Medicine

## 2014-04-24 ENCOUNTER — Ambulatory Visit (INDEPENDENT_AMBULATORY_CARE_PROVIDER_SITE_OTHER): Payer: 59 | Admitting: Internal Medicine

## 2014-04-24 ENCOUNTER — Other Ambulatory Visit (INDEPENDENT_AMBULATORY_CARE_PROVIDER_SITE_OTHER): Payer: 59

## 2014-04-24 ENCOUNTER — Encounter: Payer: Self-pay | Admitting: Internal Medicine

## 2014-04-24 VITALS — BP 128/80 | HR 59 | Temp 98.2°F | Ht 66.0 in | Wt 238.0 lb

## 2014-04-24 DIAGNOSIS — E785 Hyperlipidemia, unspecified: Secondary | ICD-10-CM

## 2014-04-24 DIAGNOSIS — I10 Essential (primary) hypertension: Secondary | ICD-10-CM

## 2014-04-24 DIAGNOSIS — R739 Hyperglycemia, unspecified: Secondary | ICD-10-CM

## 2014-04-24 DIAGNOSIS — M79609 Pain in unspecified limb: Secondary | ICD-10-CM

## 2014-04-24 DIAGNOSIS — R202 Paresthesia of skin: Secondary | ICD-10-CM | POA: Insufficient documentation

## 2014-04-24 LAB — BASIC METABOLIC PANEL
BUN: 14 mg/dL (ref 6–23)
CO2: 26 mEq/L (ref 19–32)
CREATININE: 1 mg/dL (ref 0.4–1.5)
Calcium: 9.6 mg/dL (ref 8.4–10.5)
Chloride: 103 mEq/L (ref 96–112)
GFR: 79.98 mL/min (ref >60.00)
Glucose, Bld: 106 mg/dL — ABNORMAL HIGH (ref 70–99)
Potassium: 3.9 mEq/L (ref 3.5–5.1)
Sodium: 137 mEq/L (ref 135–145)

## 2014-04-24 LAB — TSH: TSH: 0.86 u[IU]/mL (ref 0.35–4.50)

## 2014-04-24 LAB — HEMOGLOBIN A1C: HEMOGLOBIN A1C: 5.9 % (ref 4.6–6.5)

## 2014-04-24 LAB — LIPID PANEL
Cholesterol: 235 mg/dL — ABNORMAL HIGH (ref 0–200)
HDL: 28.5 mg/dL — ABNORMAL LOW (ref >39.00)
LDL Cholesterol: 172 mg/dL — ABNORMAL HIGH (ref 0–99)
NonHDL: 206.5
Total CHOL/HDL Ratio: 8
Triglycerides: 173 mg/dL — ABNORMAL HIGH (ref 0.0–149.0)
VLDL: 34.6 mg/dL (ref 0.0–40.0)

## 2014-04-24 LAB — VITAMIN B12: VITAMIN B 12: 572 pg/mL (ref 211–911)

## 2014-04-24 NOTE — Progress Notes (Signed)
   Subjective:    HPI  C/o L shoulder pain and fingers on L 4-5th  Finger numbness Working 65 h/wk   He is here for a f/u of being dizzy at times - has to use Meclizine rarely F/u OA and elevated BP  Wt Readings from Last 3 Encounters:  04/24/14 238 lb (107.956 kg)  09/12/13 236 lb (107.049 kg)  03/21/13 238 lb (107.956 kg)   BP Readings from Last 3 Encounters:  04/24/14 128/80  09/12/13 140/90  03/21/13 140/90     Review of Systems  Constitutional: Negative for appetite change, fatigue and unexpected weight change.  HENT: Negative for congestion, nosebleeds, sneezing, sore throat and trouble swallowing.   Eyes: Negative for itching and visual disturbance.  Respiratory: Negative for cough.   Cardiovascular: Negative for chest pain, palpitations and leg swelling.  Gastrointestinal: Negative for nausea, diarrhea, blood in stool and abdominal distention.  Genitourinary: Negative for frequency and hematuria.  Musculoskeletal: Positive for arthralgias (knees) and gait problem. Negative for back pain, joint swelling and neck pain.  Skin: Negative for rash.  Neurological: Positive for dizziness. Negative for tremors, speech difficulty and weakness.  Psychiatric/Behavioral: Negative for sleep disturbance, dysphoric mood and agitation. The patient is not nervous/anxious.        Objective:   Physical Exam  Constitutional: He is oriented to person, place, and time. He appears well-developed.  Obese  HENT:  Mouth/Throat: Oropharynx is clear and moist.  Eyes: Conjunctivae are normal. Pupils are equal, round, and reactive to light.  Neck: Normal range of motion. No JVD present. No thyromegaly present.  Cardiovascular: Normal rate, regular rhythm, normal heart sounds and intact distal pulses.  Exam reveals no gallop and no friction rub.   No murmur heard. Pulmonary/Chest: Effort normal and breath sounds normal. No respiratory distress. He has no wheezes. He has no rales. He  exhibits no tenderness.  Abdominal: Soft. Bowel sounds are normal. He exhibits no distension and no mass. There is no tenderness. There is no rebound and no guarding.  Genitourinary: Rectum normal. Guaiac negative stool.  Musculoskeletal: Normal range of motion. He exhibits no edema and no tenderness.  Lymphadenopathy:    He has no cervical adenopathy.  Neurological: He is alert and oriented to person, place, and time. He has normal reflexes. No cranial nerve deficit. He exhibits normal muscle tone. Coordination normal.  Skin: Skin is warm and dry. No rash noted.  Psychiatric: He has a normal mood and affect. His behavior is normal. Judgment and thought content normal.  Rectal WNL Midline abd hernia   Lab Results  Component Value Date   WBC 7.8 09/12/2013   HGB 15.3 09/12/2013   HCT 44.7 09/12/2013   PLT 269.0 09/12/2013   GLUCOSE 103* 09/12/2013   CHOL 217* 09/12/2013   TRIG 112.0 09/12/2013   HDL 30.10* 09/12/2013   LDLDIRECT 154.1 03/20/2013   LDLCALC 165* 09/12/2013   ALT 74* 09/12/2013   AST 41* 09/12/2013   NA 141 09/12/2013   K 4.1 09/12/2013   CL 106 09/12/2013   CREATININE 0.9 09/12/2013   BUN 13 09/12/2013   CO2 30 09/12/2013   TSH 0.91 09/12/2013   PSA 0.89 09/12/2013   HGBA1C 5.8 09/12/2013        Assessment & Plan:

## 2014-04-24 NOTE — Assessment & Plan Note (Signed)
Chronic  Intolerant of statins Labs

## 2014-04-24 NOTE — Assessment & Plan Note (Signed)
Continue with current prescription therapy as reflected on the Med list.  

## 2014-04-24 NOTE — Assessment & Plan Note (Signed)
L shoulder, fingers #4-5 ?mech pressure vs other Sports med ref

## 2014-04-24 NOTE — Patient Instructions (Signed)
Call if not better 

## 2014-04-24 NOTE — Assessment & Plan Note (Signed)
Labs

## 2014-08-05 ENCOUNTER — Other Ambulatory Visit: Payer: Self-pay | Admitting: Internal Medicine

## 2014-08-28 ENCOUNTER — Encounter: Payer: Self-pay | Admitting: Internal Medicine

## 2014-08-28 ENCOUNTER — Ambulatory Visit (INDEPENDENT_AMBULATORY_CARE_PROVIDER_SITE_OTHER): Payer: 59 | Admitting: Internal Medicine

## 2014-08-28 VITALS — BP 130/80 | HR 61 | Temp 98.7°F | Wt 236.0 lb

## 2014-08-28 DIAGNOSIS — Z0000 Encounter for general adult medical examination without abnormal findings: Secondary | ICD-10-CM

## 2014-08-28 DIAGNOSIS — R739 Hyperglycemia, unspecified: Secondary | ICD-10-CM

## 2014-08-28 DIAGNOSIS — E785 Hyperlipidemia, unspecified: Secondary | ICD-10-CM

## 2014-08-28 DIAGNOSIS — R635 Abnormal weight gain: Secondary | ICD-10-CM | POA: Diagnosis not present

## 2014-08-28 DIAGNOSIS — I10 Essential (primary) hypertension: Secondary | ICD-10-CM

## 2014-08-28 DIAGNOSIS — L570 Actinic keratosis: Secondary | ICD-10-CM

## 2014-08-28 MED ORDER — ROPINIROLE HCL 1 MG PO TABS: ORAL_TABLET | ORAL | Status: DC

## 2014-08-28 MED ORDER — MECLIZINE HCL 12.5 MG PO TABS: 12.5000 mg | ORAL_TABLET | Freq: Three times a day (TID) | ORAL | Status: DC | PRN

## 2014-08-28 MED ORDER — ESOMEPRAZOLE MAGNESIUM 40 MG PO CPDR: 40.0000 mg | DELAYED_RELEASE_CAPSULE | Freq: Every day | ORAL | Status: DC

## 2014-08-28 MED ORDER — TRIAMCINOLONE ACETONIDE 0.5 % EX CREA: TOPICAL_CREAM | Freq: Three times a day (TID) | CUTANEOUS | Status: DC

## 2014-08-28 MED ORDER — VALSARTAN-HYDROCHLOROTHIAZIDE 320-25 MG PO TABS: 1.0000 | ORAL_TABLET | Freq: Every day | ORAL | Status: DC

## 2014-08-28 MED ORDER — AMLODIPINE BESYLATE 5 MG PO TABS: 5.0000 mg | ORAL_TABLET | Freq: Every day | ORAL | Status: DC

## 2014-08-28 MED ORDER — CLOTRIMAZOLE-BETAMETHASONE 1-0.05 % EX CREA: TOPICAL_CREAM | Freq: Two times a day (BID) | CUTANEOUS | Status: DC

## 2014-08-28 NOTE — Assessment & Plan Note (Signed)
Nl CBGs per pt Not taking Metformin

## 2014-08-28 NOTE — Assessment & Plan Note (Signed)
Resolved

## 2014-08-28 NOTE — Assessment & Plan Note (Signed)
On Norvasc and Diovan HCT 

## 2014-08-28 NOTE — Assessment & Plan Note (Signed)
Cryo - see procedure

## 2014-08-28 NOTE — Progress Notes (Signed)
Subjective:    HPI  F/u L shoulder pain and fingers on L 4-5th  Finger numbness - resolved. Not taking Metformin - CBGs are good Working 65 h/wk   He is here for a f/u of being dizzy at times - has to use Meclizine rarely F/u OA and elevated BP  Wt Readings from Last 3 Encounters:  08/28/14 236 lb (107.049 kg)  04/24/14 238 lb (107.956 kg)  09/12/13 236 lb (107.049 kg)   BP Readings from Last 3 Encounters:  08/28/14 130/80  04/24/14 128/80  09/12/13 140/90     Review of Systems  Constitutional: Negative for appetite change, fatigue and unexpected weight change.  HENT: Negative for congestion, nosebleeds, sneezing, sore throat and trouble swallowing.   Eyes: Negative for itching and visual disturbance.  Respiratory: Negative for cough.   Cardiovascular: Negative for chest pain, palpitations and leg swelling.  Gastrointestinal: Negative for nausea, diarrhea, blood in stool and abdominal distention.  Genitourinary: Negative for frequency and hematuria.  Musculoskeletal: Positive for arthralgias (knees) and gait problem. Negative for back pain, joint swelling and neck pain.  Skin: Negative for rash.  Neurological: Positive for dizziness. Negative for tremors, speech difficulty and weakness.  Psychiatric/Behavioral: Negative for sleep disturbance, dysphoric mood and agitation. The patient is not nervous/anxious.        Objective:   Physical Exam  Constitutional: He is oriented to person, place, and time. He appears well-developed.  Obese  HENT:  Mouth/Throat: Oropharynx is clear and moist.  Eyes: Conjunctivae are normal. Pupils are equal, round, and reactive to light.  Neck: Normal range of motion. No JVD present. No thyromegaly present.  Cardiovascular: Normal rate, regular rhythm, normal heart sounds and intact distal pulses.  Exam reveals no gallop and no friction rub.   No murmur heard. Pulmonary/Chest: Effort normal and breath sounds normal. No respiratory  distress. He has no wheezes. He has no rales. He exhibits no tenderness.  Abdominal: Soft. Bowel sounds are normal. He exhibits no distension and no mass. There is no tenderness. There is no rebound and no guarding.  Genitourinary: Rectum normal. Guaiac negative stool.  Musculoskeletal: Normal range of motion. He exhibits no edema or tenderness.  Lymphadenopathy:    He has no cervical adenopathy.  Neurological: He is alert and oriented to person, place, and time. He has normal reflexes. No cranial nerve deficit. He exhibits normal muscle tone. Coordination normal.  Skin: Skin is warm and dry. No rash noted.  Psychiatric: He has a normal mood and affect. His behavior is normal. Judgment and thought content normal.   Midline abd hernia AKs   Lab Results  Component Value Date   WBC 7.8 09/12/2013   HGB 15.3 09/12/2013   HCT 44.7 09/12/2013   PLT 269.0 09/12/2013   GLUCOSE 106* 04/24/2014   CHOL 235* 04/24/2014   TRIG 173.0* 04/24/2014   HDL 28.50* 04/24/2014   LDLDIRECT 154.1 03/20/2013   LDLCALC 172* 04/24/2014   ALT 74* 09/12/2013   AST 41* 09/12/2013   NA 137 04/24/2014   K 3.9 04/24/2014   CL 103 04/24/2014   CREATININE 1.0 04/24/2014   BUN 14 04/24/2014   CO2 26 04/24/2014   TSH 0.86 04/24/2014   PSA 0.89 09/12/2013   HGBA1C 5.9 04/24/2014     Procedure Note :     Procedure : Cryosurgery   Indication:  Actinic keratoses   Risks including unsuccessful procedure , bleeding, infection, bruising, scar, a need for a repeat  procedure and others  were explained to the patient in detail as well as the benefits. Informed consent was obtained verbally.    5 lesion(s)  on face and scalp  was/were treated with liquid nitrogen on a Q-tip in a usual fasion . Band-Aid was applied and antibiotic ointment was given for a later use.   Tolerated well. Complications none.   Postprocedure instructions :     Keep the wounds clean. You can wash them with liquid soap and water. Pat dry  with gauze or a Kleenex tissue  Before applying antibiotic ointment and a Band-Aid.   You need to report immediately  if  any signs of infection develop.       Assessment & Plan:

## 2014-08-28 NOTE — Assessment & Plan Note (Signed)
Try Cholestoff OTC

## 2014-08-28 NOTE — Patient Instructions (Signed)
Try Cholestoff

## 2014-09-28 ENCOUNTER — Encounter: Payer: Self-pay | Admitting: Gastroenterology

## 2014-10-24 ENCOUNTER — Other Ambulatory Visit: Payer: Self-pay | Admitting: Internal Medicine

## 2014-10-26 ENCOUNTER — Other Ambulatory Visit: Payer: Self-pay

## 2014-10-26 ENCOUNTER — Encounter: Payer: Self-pay | Admitting: Gastroenterology

## 2014-11-06 ENCOUNTER — Ambulatory Visit: Payer: 59 | Admitting: Internal Medicine

## 2014-11-24 ENCOUNTER — Ambulatory Visit: Payer: 59 | Admitting: Internal Medicine

## 2014-12-07 ENCOUNTER — Ambulatory Visit (INDEPENDENT_AMBULATORY_CARE_PROVIDER_SITE_OTHER): Payer: 59 | Admitting: Internal Medicine

## 2014-12-07 ENCOUNTER — Other Ambulatory Visit (INDEPENDENT_AMBULATORY_CARE_PROVIDER_SITE_OTHER): Payer: 59

## 2014-12-07 ENCOUNTER — Encounter: Payer: Self-pay | Admitting: Internal Medicine

## 2014-12-07 VITALS — BP 112/70 | HR 60 | Wt 234.0 lb

## 2014-12-07 DIAGNOSIS — M79609 Pain in unspecified limb: Secondary | ICD-10-CM

## 2014-12-07 DIAGNOSIS — R7989 Other specified abnormal findings of blood chemistry: Secondary | ICD-10-CM | POA: Diagnosis not present

## 2014-12-07 DIAGNOSIS — G4733 Obstructive sleep apnea (adult) (pediatric): Secondary | ICD-10-CM | POA: Diagnosis not present

## 2014-12-07 DIAGNOSIS — I10 Essential (primary) hypertension: Secondary | ICD-10-CM | POA: Diagnosis not present

## 2014-12-07 DIAGNOSIS — Z0000 Encounter for general adult medical examination without abnormal findings: Secondary | ICD-10-CM | POA: Diagnosis not present

## 2014-12-07 DIAGNOSIS — R739 Hyperglycemia, unspecified: Secondary | ICD-10-CM

## 2014-12-07 DIAGNOSIS — M8949 Other hypertrophic osteoarthropathy, multiple sites: Secondary | ICD-10-CM

## 2014-12-07 DIAGNOSIS — M159 Polyosteoarthritis, unspecified: Secondary | ICD-10-CM

## 2014-12-07 DIAGNOSIS — R202 Paresthesia of skin: Secondary | ICD-10-CM

## 2014-12-07 DIAGNOSIS — M150 Primary generalized (osteo)arthritis: Secondary | ICD-10-CM

## 2014-12-07 LAB — CBC WITH DIFFERENTIAL/PLATELET
BASOS PCT: 0.4 % (ref 0.0–3.0)
Basophils Absolute: 0 10*3/uL (ref 0.0–0.1)
EOS PCT: 1.8 % (ref 0.0–5.0)
Eosinophils Absolute: 0.2 10*3/uL (ref 0.0–0.7)
HCT: 47.8 % (ref 39.0–52.0)
Hemoglobin: 16.4 g/dL (ref 13.0–17.0)
LYMPHS PCT: 22 % (ref 12.0–46.0)
Lymphs Abs: 2.1 10*3/uL (ref 0.7–4.0)
MCHC: 34.4 g/dL (ref 30.0–36.0)
MCV: 92.4 fl (ref 78.0–100.0)
MONOS PCT: 8.5 % (ref 3.0–12.0)
Monocytes Absolute: 0.8 10*3/uL (ref 0.1–1.0)
NEUTROS PCT: 67.3 % (ref 43.0–77.0)
Neutro Abs: 6.5 10*3/uL (ref 1.4–7.7)
Platelets: 286 10*3/uL (ref 150.0–400.0)
RBC: 5.17 Mil/uL (ref 4.22–5.81)
RDW: 13.4 % (ref 11.5–15.5)
WBC: 9.7 10*3/uL (ref 4.0–10.5)

## 2014-12-07 LAB — LIPID PANEL
CHOLESTEROL: 207 mg/dL — AB (ref 0–200)
HDL: 27.5 mg/dL — ABNORMAL LOW (ref >39.00)
NonHDL: 179.5
TRIGLYCERIDES: 205 mg/dL — AB (ref 0.0–149.0)
Total CHOL/HDL Ratio: 8
VLDL: 41 mg/dL — AB (ref 0.0–40.0)

## 2014-12-07 LAB — URINALYSIS
Bilirubin Urine: NEGATIVE
Hgb urine dipstick: NEGATIVE
Ketones, ur: NEGATIVE
Leukocytes, UA: NEGATIVE
NITRITE: NEGATIVE
PH: 5.5 (ref 5.0–8.0)
Total Protein, Urine: NEGATIVE
UROBILINOGEN UA: 0.2 (ref 0.0–1.0)
Urine Glucose: NEGATIVE

## 2014-12-07 LAB — BASIC METABOLIC PANEL
BUN: 17 mg/dL (ref 6–23)
CALCIUM: 9.8 mg/dL (ref 8.4–10.5)
CO2: 26 mEq/L (ref 19–32)
CREATININE: 0.94 mg/dL (ref 0.40–1.50)
Chloride: 102 mEq/L (ref 96–112)
GFR: 86.71 mL/min (ref >60.00)
Glucose, Bld: 103 mg/dL — ABNORMAL HIGH (ref 70–99)
POTASSIUM: 3.8 meq/L (ref 3.5–5.1)
SODIUM: 137 meq/L (ref 135–145)

## 2014-12-07 LAB — HEMOGLOBIN A1C: Hgb A1c MFr Bld: 5.7 % (ref 4.6–6.5)

## 2014-12-07 LAB — HEPATIC FUNCTION PANEL
ALBUMIN: 4.3 g/dL (ref 3.5–5.2)
ALT: 76 U/L — AB (ref 0–53)
AST: 40 U/L — ABNORMAL HIGH (ref 0–37)
Alkaline Phosphatase: 60 U/L (ref 39–117)
BILIRUBIN TOTAL: 1.2 mg/dL (ref 0.2–1.2)
Bilirubin, Direct: 0.2 mg/dL (ref 0.0–0.3)
Total Protein: 6.9 g/dL (ref 6.0–8.3)

## 2014-12-07 LAB — LDL CHOLESTEROL, DIRECT: Direct LDL: 147 mg/dL

## 2014-12-07 LAB — TSH: TSH: 0.9 u[IU]/mL (ref 0.35–4.50)

## 2014-12-07 LAB — PSA: PSA: 0.23 ng/mL (ref 0.10–4.00)

## 2014-12-07 NOTE — Assessment & Plan Note (Signed)
On CPAP. ?

## 2014-12-07 NOTE — Assessment & Plan Note (Signed)
On Ibuprofen prn

## 2014-12-07 NOTE — Progress Notes (Signed)
Pre visit review using our clinic review tool, if applicable. No additional management support is needed unless otherwise documented below in the visit note. 

## 2014-12-07 NOTE — Progress Notes (Signed)
   Subjective:    HPI  F/u L shoulder pain and fingers on L 4-5th  Finger numbness - resolved - better. Not taking Metformin - CBGs are good Working 65 h/wk   He is here for a f/u of being dizzy at times - has to use Meclizine rarely F/u OA and elevated BP  Wt Readings from Last 3 Encounters:  12/07/14 234 lb (106.142 kg)  08/28/14 236 lb (107.049 kg)  04/24/14 238 lb (107.956 kg)   BP Readings from Last 3 Encounters:  12/07/14 112/70  08/28/14 130/80  04/24/14 128/80     Review of Systems  Constitutional: Negative for appetite change, fatigue and unexpected weight change.  HENT: Negative for congestion, nosebleeds, sneezing, sore throat and trouble swallowing.   Eyes: Negative for itching and visual disturbance.  Respiratory: Negative for cough.   Cardiovascular: Negative for chest pain, palpitations and leg swelling.  Gastrointestinal: Negative for nausea, diarrhea, blood in stool and abdominal distention.  Genitourinary: Negative for frequency and hematuria.  Musculoskeletal: Positive for arthralgias (knees) and gait problem. Negative for back pain, joint swelling and neck pain.  Skin: Negative for rash.  Neurological: Positive for dizziness. Negative for tremors, speech difficulty and weakness.  Psychiatric/Behavioral: Negative for sleep disturbance, dysphoric mood and agitation. The patient is not nervous/anxious.        Objective:   Physical Exam  Constitutional: He is oriented to person, place, and time. He appears well-developed.  Obese  HENT:  Mouth/Throat: Oropharynx is clear and moist.  Eyes: Conjunctivae are normal. Pupils are equal, round, and reactive to light.  Neck: Normal range of motion. No JVD present. No thyromegaly present.  Cardiovascular: Normal rate, regular rhythm, normal heart sounds and intact distal pulses.  Exam reveals no gallop and no friction rub.   No murmur heard. Pulmonary/Chest: Effort normal and breath sounds normal. No  respiratory distress. He has no wheezes. He has no rales. He exhibits no tenderness.  Abdominal: Soft. Bowel sounds are normal. He exhibits no distension and no mass. There is no tenderness. There is no rebound and no guarding.  Genitourinary: Rectum normal. Guaiac negative stool.  Musculoskeletal: Normal range of motion. He exhibits no edema or tenderness.  Lymphadenopathy:    He has no cervical adenopathy.  Neurological: He is alert and oriented to person, place, and time. He has normal reflexes. No cranial nerve deficit. He exhibits normal muscle tone. Coordination normal.  Skin: Skin is warm and dry. No rash noted.  Psychiatric: He has a normal mood and affect. His behavior is normal. Judgment and thought content normal.   Midline abd hernia    Lab Results  Component Value Date   WBC 7.8 09/12/2013   HGB 15.3 09/12/2013   HCT 44.7 09/12/2013   PLT 269.0 09/12/2013   GLUCOSE 106* 04/24/2014   CHOL 235* 04/24/2014   TRIG 173.0* 04/24/2014   HDL 28.50* 04/24/2014   LDLDIRECT 154.1 03/20/2013   LDLCALC 172* 04/24/2014   ALT 74* 09/12/2013   AST 41* 09/12/2013   NA 137 04/24/2014   K 3.9 04/24/2014   CL 103 04/24/2014   CREATININE 1.0 04/24/2014   BUN 14 04/24/2014   CO2 26 04/24/2014   TSH 0.86 04/24/2014   PSA 0.89 09/12/2013   HGBA1C 5.9 04/24/2014         Assessment & Plan:

## 2014-12-07 NOTE — Assessment & Plan Note (Signed)
  On Norvasc and Diovan HCT

## 2014-12-07 NOTE — Assessment & Plan Note (Signed)
Resolved

## 2014-12-25 ENCOUNTER — Ambulatory Visit (AMBULATORY_SURGERY_CENTER): Payer: Self-pay | Admitting: *Deleted

## 2014-12-25 VITALS — Ht 66.0 in | Wt 234.6 lb

## 2014-12-25 DIAGNOSIS — Z86010 Personal history of colonic polyps: Secondary | ICD-10-CM

## 2014-12-25 MED ORDER — NA SULFATE-K SULFATE-MG SULF 17.5-3.13-1.6 GM/177ML PO SOLN
ORAL | Status: DC
Start: 2014-12-25 — End: 2015-01-08

## 2014-12-25 NOTE — Progress Notes (Signed)
No allergies to eggs or soy. No problems with anesthesia.  Pt given Emmi instructions for colonoscopy  No oxygen use  No diet drug use  

## 2015-01-08 ENCOUNTER — Encounter: Payer: Self-pay | Admitting: Gastroenterology

## 2015-01-08 ENCOUNTER — Ambulatory Visit (AMBULATORY_SURGERY_CENTER): Payer: Commercial Managed Care - PPO | Admitting: Gastroenterology

## 2015-01-08 VITALS — BP 94/46 | HR 63 | Temp 97.2°F | Resp 19 | Ht 66.0 in | Wt 234.0 lb

## 2015-01-08 DIAGNOSIS — D123 Benign neoplasm of transverse colon: Secondary | ICD-10-CM | POA: Diagnosis not present

## 2015-01-08 DIAGNOSIS — Z86010 Personal history of colonic polyps: Secondary | ICD-10-CM

## 2015-01-08 DIAGNOSIS — K635 Polyp of colon: Secondary | ICD-10-CM | POA: Diagnosis not present

## 2015-01-08 DIAGNOSIS — Z800 Family history of malignant neoplasm of digestive organs: Secondary | ICD-10-CM

## 2015-01-08 MED ORDER — SODIUM CHLORIDE 0.9 % IV SOLN: 500.0000 mL | INTRAVENOUS | Status: DC

## 2015-01-08 NOTE — Patient Instructions (Signed)
YOU HAD AN ENDOSCOPIC PROCEDURE TODAY AT [facility]:   Refer to the procedure report that was given to you for any specific questions about what was found during the examination.  If the procedure report does not answer your questions, please call your gastroenterologist to clarify.  If you requested that your care partner not be given the details of your procedure findings, then the procedure report has been included in a sealed envelope for you to review at your convenience later.  YOU SHOULD EXPECT: Some feelings of bloating in the abdomen. Passage of more gas than usual.  Walking can help get rid of the air that was put into your GI tract during the procedure and reduce the bloating. If you had a lower endoscopy (such as a colonoscopy or flexible sigmoidoscopy) you may notice spotting of blood in your stool or on the toilet paper. If you underwent a bowel prep for your procedure, you may not have a normal bowel movement for a few days.  Please Note:  You might notice some irritation and congestion in your nose or some drainage.  This is from the oxygen used during your procedure.  There is no need for concern and it should clear up in a day or so.  SYMPTOMS TO REPORT IMMEDIATELY:   Following lower endoscopy (colonoscopy or flexible sigmoidoscopy):  Excessive amounts of blood in the stool  Significant tenderness or worsening of abdominal pains  Swelling of the abdomen that is new, acute  Fever of 100F or higher   For urgent or emergent issues, a gastroenterologist can be reached at any hour by calling [phone].   DIET: Your first meal following the procedure should be a small meal and then it is ok to progress to your normal diet. Heavy or fried foods are harder to digest and may make you feel nauseous or bloated.  Likewise, meals heavy in dairy and vegetables can increase bloating.  Drink plenty of fluids but you should avoid alcoholic beverages for 24  hours.  ACTIVITY:  You should plan to take it easy for the rest of today and you should NOT DRIVE or use heavy machinery until tomorrow (because of the sedation medicines used during the test).    FOLLOW UP: Our staff will call the number listed on your records the next business day following your procedure to check on you and address any questions or concerns that you may have regarding the information given to you following your procedure. If we do not reach you, we will leave a message.  However, if you are feeling well and you are not experiencing any problems, there is no need to return our call.  We will assume that you have returned to your regular daily activities without incident.  If any biopsies were taken you will be contacted by phone or by letter within the next 1-3 weeks.  Please call us at [phone] if you have not heard about the biopsies in 3 weeks.    SIGNATURES/CONFIDENTIALITY: You and/or your care partner have signed paperwork which will be entered into your electronic medical record.  These signatures attest to the fact that that the information above on your After Visit Summary has been reviewed and is understood.  Full responsibility of the confidentiality of this discharge information lies with you and/or your care-partner.  Polyp, diverticulosis, high fiber diet with liberal fluid intake.

## 2015-01-08 NOTE — Progress Notes (Signed)
Called to room to assist during endoscopic procedure.  Patient ID and intended procedure confirmed with present staff. Received instructions for my participation in the procedure from the performing physician.  

## 2015-01-08 NOTE — Progress Notes (Signed)
Report to PACU, RN, vss, BBS= Clear.  

## 2015-01-08 NOTE — Op Note (Signed)
[place]  [address] [place], [id number]   COLONOSCOPY PROCEDURE REPORT  PATIENT: Jerry Cohen, Jerry Cohen  MR#: [id number] BIRTHDATE: 02-09-1956 , 59  yrs. old GENDER: male ENDOSCOPIST: Ladene Artist, MD, [facility] PROCEDURE DATE:  01/08/2015 PROCEDURE:   Colonoscopy, surveillance , Colonoscopy with biopsy, and Colonoscopy with snare polypectomy First Screening Colonoscopy - Avg.  risk and is 50 yrs.  old or older - No.  Prior Negative Screening - Now for repeat screening. N/A  History of Adenoma - Now for follow-up colonoscopy & has been > or = to 3 yrs.  Yes hx of adenoma.  Has been 3 or more years since last colonoscopy.  Polyps removed today? Yes ASA CLASS:   Class III INDICATIONS:Surveillance due to prior colonic neoplasia, PH Colon Adenoma, and FH Colon or Rectal Adenocarcinoma. MEDICATIONS: Monitored anesthesia care, Propofol 200 mg IV, and lidocaine 200 mg IV DESCRIPTION OF PROCEDURE:   After the risks benefits and alternatives of the procedure were thoroughly explained, informed consent was obtained.  The digital rectal exam revealed no abnormalities of the rectum.   The LB [id number]  endoscope was introduced through the anus and advanced to the cecum, which was identified by both the appendix and ileocecal valve. No adverse events experienced.   The quality of the prep was excellent. (Suprep was used)  The instrument was then slowly withdrawn as the colon was fully examined. Estimated blood loss is zero unless otherwise noted in this procedure report.    COLON FINDINGS: Two sessile polyps ranging between 4-78mm in size were found in the transverse colon.  Polypectomies were performed with a cold snare (83mm polyp) and with cold forceps (4 mm polyp) The resections were complete, the polyp tissue was completely retrieved and sent to histology.   There was mild diverticulosis noted in the sigmoid colon and transverse colon.   The examination was  otherwise normal.  Retroflexed views revealed internal Grade I hemorrhoids. The time to cecum = 1.6 Withdrawal time = 9.7   The scope was withdrawn and the procedure completed. COMPLICATIONS: There were no immediate complications.  ENDOSCOPIC IMPRESSION: 1.   Two sessile polyps in the transverse colon; polypectomies performed with a cold snare and with cold forceps 2.   Mild diverticulosis in the sigmoid colon and transverse colon 3.   Grade l internal hemorrhoids  RECOMMENDATIONS: 1.  Await pathology results 2.  High fiber diet with liberal fluid intake. 3.  Repeat Colonoscopy in 3 years.  eSigned:  Ladene Artist, MD, [facility] 01/08/2015 9:57 AM  [C

## 2015-01-11 ENCOUNTER — Telehealth: Payer: Self-pay

## 2015-01-11 NOTE — Telephone Encounter (Signed)
Left a message at [phone] for the pt to call us back if any questions or concerns. maw

## 2015-01-14 ENCOUNTER — Encounter: Payer: Self-pay | Admitting: Gastroenterology

## 2015-03-17 ENCOUNTER — Other Ambulatory Visit: Payer: Self-pay | Admitting: Internal Medicine

## 2015-06-08 ENCOUNTER — Encounter: Payer: Self-pay | Admitting: Internal Medicine

## 2015-06-08 ENCOUNTER — Ambulatory Visit (INDEPENDENT_AMBULATORY_CARE_PROVIDER_SITE_OTHER): Payer: Commercial Managed Care - PPO | Admitting: Internal Medicine

## 2015-06-08 ENCOUNTER — Other Ambulatory Visit (INDEPENDENT_AMBULATORY_CARE_PROVIDER_SITE_OTHER): Payer: Commercial Managed Care - PPO

## 2015-06-08 VITALS — BP 100/64 | HR 63 | Ht 66.0 in | Wt 237.0 lb

## 2015-06-08 DIAGNOSIS — R739 Hyperglycemia, unspecified: Secondary | ICD-10-CM

## 2015-06-08 DIAGNOSIS — Z0000 Encounter for general adult medical examination without abnormal findings: Secondary | ICD-10-CM | POA: Diagnosis not present

## 2015-06-08 DIAGNOSIS — Z86010 Personal history of colonic polyps: Secondary | ICD-10-CM | POA: Diagnosis not present

## 2015-06-08 DIAGNOSIS — I10 Essential (primary) hypertension: Secondary | ICD-10-CM | POA: Diagnosis not present

## 2015-06-08 DIAGNOSIS — K219 Gastro-esophageal reflux disease without esophagitis: Secondary | ICD-10-CM

## 2015-06-08 DIAGNOSIS — M2550 Pain in unspecified joint: Secondary | ICD-10-CM

## 2015-06-08 DIAGNOSIS — R7989 Other specified abnormal findings of blood chemistry: Secondary | ICD-10-CM

## 2015-06-08 DIAGNOSIS — R945 Abnormal results of liver function studies: Secondary | ICD-10-CM

## 2015-06-08 LAB — IBC PANEL
IRON: 69 ug/dL (ref 42–165)
Saturation Ratios: 17.4 % — ABNORMAL LOW (ref 20.0–50.0)
TRANSFERRIN: 283 mg/dL (ref 212.0–360.0)

## 2015-06-08 LAB — HEMOGLOBIN A1C: Hgb A1c MFr Bld: 6.1 % (ref 4.6–6.5)

## 2015-06-08 LAB — BASIC METABOLIC PANEL
BUN: 14 mg/dL (ref 6–23)
CHLORIDE: 104 meq/L (ref 96–112)
CO2: 27 meq/L (ref 19–32)
CREATININE: 0.92 mg/dL (ref 0.40–1.50)
Calcium: 9.6 mg/dL (ref 8.4–10.5)
GFR: 88.74 mL/min (ref >60.00)
GLUCOSE: 109 mg/dL — AB (ref 70–99)
Potassium: 3.8 mEq/L (ref 3.5–5.1)
SODIUM: 140 meq/L (ref 135–145)

## 2015-06-08 LAB — URINALYSIS
Bilirubin Urine: NEGATIVE
Hgb urine dipstick: NEGATIVE
Ketones, ur: NEGATIVE
LEUKOCYTES UA: NEGATIVE
NITRITE: NEGATIVE
PH: 6 (ref 5.0–8.0)
SPECIFIC GRAVITY, URINE: 1.025 (ref 1.000–1.030)
TOTAL PROTEIN, URINE-UPE24: NEGATIVE
Urine Glucose: NEGATIVE
Urobilinogen, UA: 0.2 (ref 0.0–1.0)

## 2015-06-08 LAB — CBC WITH DIFFERENTIAL/PLATELET
BASOS PCT: 0.5 % (ref 0.0–3.0)
Basophils Absolute: 0 10*3/uL (ref 0.0–0.1)
Eosinophils Absolute: 0.2 10*3/uL (ref 0.0–0.7)
Eosinophils Relative: 2.1 % (ref 0.0–5.0)
HEMATOCRIT: 48.4 % (ref 39.0–52.0)
Hemoglobin: 16.6 g/dL (ref 13.0–17.0)
LYMPHS ABS: 2.2 10*3/uL (ref 0.7–4.0)
LYMPHS PCT: 24.6 % (ref 12.0–46.0)
MCHC: 34.2 g/dL (ref 30.0–36.0)
MCV: 92.2 fl (ref 78.0–100.0)
MONOS PCT: 8 % (ref 3.0–12.0)
Monocytes Absolute: 0.7 10*3/uL (ref 0.1–1.0)
NEUTROS ABS: 5.7 10*3/uL (ref 1.4–7.7)
Neutrophils Relative %: 64.8 % (ref 43.0–77.0)
PLATELETS: 274 10*3/uL (ref 150.0–400.0)
RBC: 5.26 Mil/uL (ref 4.22–5.81)
RDW: 13.2 % (ref 11.5–15.5)
WBC: 8.9 10*3/uL (ref 4.0–10.5)

## 2015-06-08 LAB — HEPATIC FUNCTION PANEL
ALK PHOS: 59 U/L (ref 39–117)
ALT: 73 U/L — ABNORMAL HIGH (ref 0–53)
AST: 37 U/L (ref 0–37)
Albumin: 4.3 g/dL (ref 3.5–5.2)
BILIRUBIN TOTAL: 1.3 mg/dL — AB (ref 0.2–1.2)
Bilirubin, Direct: 0.2 mg/dL (ref 0.0–0.3)
Total Protein: 6.9 g/dL (ref 6.0–8.3)

## 2015-06-08 LAB — LIPID PANEL
Cholesterol: 204 mg/dL — ABNORMAL HIGH (ref 0–200)
HDL: 31.3 mg/dL — ABNORMAL LOW (ref >39.00)
LDL CALC: 140 mg/dL — AB (ref 0–99)
NONHDL: 172.92
Total CHOL/HDL Ratio: 7
Triglycerides: 167 mg/dL — ABNORMAL HIGH (ref 0.0–149.0)
VLDL: 33.4 mg/dL (ref 0.0–40.0)

## 2015-06-08 LAB — PSA: PSA: 0.41 ng/mL (ref 0.10–4.00)

## 2015-06-08 LAB — TSH: TSH: 1.26 u[IU]/mL (ref 0.35–4.50)

## 2015-06-08 NOTE — Progress Notes (Signed)
Pre visit review using our clinic review tool, if applicable. No additional management support is needed unless otherwise documented below in the visit note. 

## 2015-06-08 NOTE — Assessment & Plan Note (Signed)
Next colon due in 2019

## 2015-06-08 NOTE — Assessment & Plan Note (Signed)
On Norvasc and Diovan HCT 

## 2015-06-08 NOTE — Assessment & Plan Note (Signed)
Advil prn 

## 2015-06-08 NOTE — Assessment & Plan Note (Addendum)
We discussed age appropriate health related issues, including available/recomended screening tests and vaccinations. We discussed a need for adhering to healthy diet and exercise. Labs/EKG were reviewed/ordered. All questions were answered.  rectal 3 mo ago per GI

## 2015-06-08 NOTE — Assessment & Plan Note (Signed)
Labs Wt loss and exercise

## 2015-06-08 NOTE — Assessment & Plan Note (Signed)
Nexium 

## 2015-06-08 NOTE — Progress Notes (Signed)
Subjective:  Patient ID: Jerry Cohen, male    DOB: 1956/01/06  Age: 59 y.o. MRN: [id number]  CC: Annual Exam   HPI [name] presents for a well exam. F/u elevated LFT, elev glucose, HTN  Outpatient Prescriptions Prior to Visit  Medication Sig Dispense Refill  . amLODipine (NORVASC) 5 MG tablet Take 1 tablet (5 mg total) by mouth daily. 90 tablet 3  . aspirin 81 MG EC tablet Take 81 mg by mouth daily.      . Cholecalciferol (EQL VITAMIN D3) 1000 UNITS tablet Take 1,000 Units by mouth daily.      . clotrimazole-betamethasone (LOTRISONE) cream Apply topically 2 (two) times daily. 45 g 2  . esomeprazole (NEXIUM) 40 MG capsule Take 1 capsule (40 mg total) by mouth daily. 90 capsule 3  . fish oil-omega-3 fatty acids 1000 MG capsule Take 2 g by mouth 2 (two) times daily.      . metFORMIN (GLUCOPHAGE) 500 MG tablet TAKE 1 TABLET (500 MG TOTAL) BY MOUTH DAILY WITH BREAKFAST... MAX OF 30 DAYS ON INSURANCE 90 tablet 2  . rOPINIRole (REQUIP) 1 MG tablet TAKE 1 TABLET BY MOUTH AT BEDTIME AS NEEDED FOR RESTLESS LEGS... MAX OF 30 DAYS ON INSURANCE 90 tablet 3  . triamcinolone cream (KENALOG) 0.5 % Apply topically 3 (three) times daily. 30 g 3  . valsartan-hydrochlorothiazide (DIOVAN-HCT) 320-25 MG per tablet Take 1 tablet by mouth daily. 90 tablet 3   No facility-administered medications prior to visit.    ROS Review of Systems  Constitutional: Negative for activity change, appetite change, fatigue and unexpected weight change.  HENT: Negative for congestion, nosebleeds, sneezing, sore throat and trouble swallowing.   Eyes: Negative for itching and visual disturbance.  Respiratory: Negative for cough.   Cardiovascular: Negative for chest pain, palpitations and leg swelling.  Gastrointestinal: Negative for nausea, diarrhea, blood in stool and abdominal distention.  Genitourinary: Negative for frequency and hematuria.  Musculoskeletal: Positive for arthralgias. Negative for back pain,  joint swelling, gait problem and neck pain.  Skin: Negative for rash.  Neurological: Negative for dizziness, tremors, speech difficulty and weakness.  Psychiatric/Behavioral: Negative for sleep disturbance, dysphoric mood and agitation. The patient is not nervous/anxious.     Objective:  BP 100/64 mmHg  Pulse 63  Ht 5\' 6"  (1.676 m)  Wt 237 lb (107.502 kg)  BMI 38.27 kg/m2  SpO2 95%  BP Readings from Last 3 Encounters:  06/08/15 100/64  01/08/15 94/46  12/07/14 112/70    Wt Readings from Last 3 Encounters:  06/08/15 237 lb (107.502 kg)  01/08/15 234 lb (106.142 kg)  12/25/14 234 lb 9.6 oz (106.414 kg)    Physical Exam  Constitutional: He is oriented to person, place, and time. He appears well-developed and well-nourished. No distress.  HENT:  Head: Normocephalic and atraumatic.  Right Ear: External ear normal.  Left Ear: External ear normal.  Nose: Nose normal.  Mouth/Throat: Oropharynx is clear and moist. No oropharyngeal exudate.  Eyes: Conjunctivae and EOM are normal. Pupils are equal, round, and reactive to light. Right eye exhibits no discharge. Left eye exhibits no discharge. No scleral icterus.  Neck: Normal range of motion. Neck supple. No JVD present. No tracheal deviation present. No thyromegaly present.  Cardiovascular: Normal rate, regular rhythm, normal heart sounds and intact distal pulses.  Exam reveals no gallop and no friction rub.   No murmur heard. Pulmonary/Chest: Effort normal and breath sounds normal. No stridor. No respiratory distress. He has no  wheezes. He has no rales. He exhibits no tenderness.  Abdominal: Soft. Bowel sounds are normal. He exhibits no distension and no mass. There is no tenderness. There is no rebound and no guarding.  Genitourinary: Penis normal. No penile tenderness.  Musculoskeletal: Normal range of motion. He exhibits no edema or tenderness.  Lymphadenopathy:    He has no cervical adenopathy.  Neurological: He is alert and  oriented to person, place, and time. He has normal reflexes. No cranial nerve deficit. He exhibits normal muscle tone. Coordination normal.  Skin: Skin is warm and dry. No rash noted. He is not diaphoretic. No erythema. No pallor.  Psychiatric: He has a normal mood and affect. His behavior is normal. Judgment and thought content normal.  rectal 3 mo ago per GI  Lab Results  Component Value Date   WBC 9.7 12/07/2014   HGB 16.4 12/07/2014   HCT 47.8 12/07/2014   PLT 286.0 12/07/2014   GLUCOSE 103* 12/07/2014   CHOL 207* 12/07/2014   TRIG 205.0* 12/07/2014   HDL 27.50* 12/07/2014   LDLDIRECT 147.0 12/07/2014   LDLCALC 172* 04/24/2014   ALT 76* 12/07/2014   AST 40* 12/07/2014   NA 137 12/07/2014   K 3.8 12/07/2014   CL 102 12/07/2014   CREATININE 0.94 12/07/2014   BUN 17 12/07/2014   CO2 26 12/07/2014   TSH 0.90 12/07/2014   PSA 0.23 12/07/2014   HGBA1C 5.7 12/07/2014    US Abdomen Complete  09/19/2013  CLINICAL DATA:  Elevated LFTs. EXAM: ULTRASOUND ABDOMEN COMPLETE COMPARISON:  None. FINDINGS: Gallbladder: No gallstones or wall thickening visualized. No sonographic Murphy sign noted. Common bile duct: Diameter: Normal caliber, 3 mm Liver: Diffusely increased echotexture compatible with fatty infiltration or intrinsic liver disease. Node visible focal abnormality or biliary ductal dilatation. IVC: Not well visualized due to overlying bowel gas. Pancreas: Not well visualized due to overlying bowel gas. Spleen: Size and appearance within normal limits. Right Kidney: Length: 12.4 cm. Echogenicity within normal limits. No mass or hydronephrosis visualized. Left Kidney: Length: 12.8 cm. Echogenicity within normal limits. No mass or hydronephrosis visualized. Abdominal aorta: No aneurysm visualized. Other findings: None. IMPRESSION: Diffusely increased echotexture throughout the liver compatible with fatty infiltration or intrinsic liver disease. Electronically Signed   By: Rolm Baptise M.D.    On: 09/19/2013 10:42    Assessment & Plan:   There are no diagnoses linked to this encounter. I am having Mr. Furtado maintain his aspirin, fish oil-omega-3 fatty acids, Cholecalciferol, clotrimazole-betamethasone, amLODipine, esomeprazole, rOPINIRole, valsartan-hydrochlorothiazide, triamcinolone cream, and metFORMIN.  No orders of the defined types were placed in this encounter.     Follow-up: No Follow-up on file.  Walker Kehr, MD

## 2015-06-08 NOTE — Assessment & Plan Note (Signed)
A1c - not taking Metformin

## 2015-06-09 LAB — HEPATITIS C ANTIBODY: HCV Ab: NEGATIVE

## 2015-06-16 ENCOUNTER — Telehealth: Payer: Self-pay | Admitting: *Deleted

## 2015-06-16 NOTE — Telephone Encounter (Signed)
Annual Wellness Physical form is completed and upfront for p/u. Pt informed

## 2015-08-21 ENCOUNTER — Other Ambulatory Visit: Payer: Self-pay | Admitting: Internal Medicine

## 2015-11-18 ENCOUNTER — Other Ambulatory Visit: Payer: Self-pay | Admitting: Internal Medicine

## 2015-12-07 ENCOUNTER — Other Ambulatory Visit (INDEPENDENT_AMBULATORY_CARE_PROVIDER_SITE_OTHER): Payer: Commercial Managed Care - PPO

## 2015-12-07 ENCOUNTER — Encounter: Payer: Self-pay | Admitting: Internal Medicine

## 2015-12-07 ENCOUNTER — Ambulatory Visit (INDEPENDENT_AMBULATORY_CARE_PROVIDER_SITE_OTHER): Payer: Commercial Managed Care - PPO | Admitting: Internal Medicine

## 2015-12-07 VITALS — BP 136/78 | HR 62 | Wt 241.0 lb

## 2015-12-07 DIAGNOSIS — I10 Essential (primary) hypertension: Secondary | ICD-10-CM | POA: Diagnosis not present

## 2015-12-07 DIAGNOSIS — M25512 Pain in left shoulder: Secondary | ICD-10-CM

## 2015-12-07 DIAGNOSIS — M25511 Pain in right shoulder: Secondary | ICD-10-CM

## 2015-12-07 DIAGNOSIS — M25561 Pain in right knee: Secondary | ICD-10-CM | POA: Diagnosis not present

## 2015-12-07 DIAGNOSIS — R739 Hyperglycemia, unspecified: Secondary | ICD-10-CM

## 2015-12-07 DIAGNOSIS — M25562 Pain in left knee: Secondary | ICD-10-CM

## 2015-12-07 DIAGNOSIS — M25519 Pain in unspecified shoulder: Secondary | ICD-10-CM | POA: Insufficient documentation

## 2015-12-07 LAB — CBC WITH DIFFERENTIAL/PLATELET
BASOS ABS: 0 10*3/uL (ref 0.0–0.1)
Basophils Relative: 0.3 % (ref 0.0–3.0)
Eosinophils Absolute: 0.2 10*3/uL (ref 0.0–0.7)
Eosinophils Relative: 2.5 % (ref 0.0–5.0)
HEMATOCRIT: 44.5 % (ref 39.0–52.0)
Hemoglobin: 15.1 g/dL (ref 13.0–17.0)
Lymphocytes Relative: 22.5 % (ref 12.0–46.0)
Lymphs Abs: 1.7 10*3/uL (ref 0.7–4.0)
MCHC: 33.9 g/dL (ref 30.0–36.0)
MCV: 92.8 fl (ref 78.0–100.0)
Monocytes Absolute: 0.7 10*3/uL (ref 0.1–1.0)
Monocytes Relative: 8.7 % (ref 3.0–12.0)
NEUTROS ABS: 5 10*3/uL (ref 1.4–7.7)
Neutrophils Relative %: 66 % (ref 43.0–77.0)
PLATELETS: 260 10*3/uL (ref 150.0–400.0)
RBC: 4.79 Mil/uL (ref 4.22–5.81)
RDW: 13.6 % (ref 11.5–15.5)
WBC: 7.6 10*3/uL (ref 4.0–10.5)

## 2015-12-07 LAB — BASIC METABOLIC PANEL
BUN: 13 mg/dL (ref 6–23)
CALCIUM: 9.5 mg/dL (ref 8.4–10.5)
CHLORIDE: 107 meq/L (ref 96–112)
CO2: 28 meq/L (ref 19–32)
Creatinine, Ser: 1.05 mg/dL (ref 0.40–1.50)
GFR: 76.06 mL/min (ref >60.00)
Glucose, Bld: 122 mg/dL — ABNORMAL HIGH (ref 70–99)
Potassium: 3.9 mEq/L (ref 3.5–5.1)
SODIUM: 141 meq/L (ref 135–145)

## 2015-12-07 LAB — HEPATIC FUNCTION PANEL
ALK PHOS: 47 U/L (ref 39–117)
ALT: 74 U/L — AB (ref 0–53)
AST: 38 U/L — AB (ref 0–37)
Albumin: 4.1 g/dL (ref 3.5–5.2)
BILIRUBIN DIRECT: 0.1 mg/dL (ref 0.0–0.3)
TOTAL PROTEIN: 7 g/dL (ref 6.0–8.3)
Total Bilirubin: 1 mg/dL (ref 0.2–1.2)

## 2015-12-07 LAB — HEMOGLOBIN A1C: Hgb A1c MFr Bld: 6 % (ref 4.6–6.5)

## 2015-12-07 LAB — SEDIMENTATION RATE: Sed Rate: 9 mm/hr (ref 0–20)

## 2015-12-07 MED ORDER — VALSARTAN-HYDROCHLOROTHIAZIDE 320-25 MG PO TABS: 1.0000 | ORAL_TABLET | Freq: Every day | ORAL | Status: DC

## 2015-12-07 MED ORDER — ROPINIROLE HCL 1 MG PO TABS: ORAL_TABLET | ORAL | Status: DC

## 2015-12-07 MED ORDER — METFORMIN HCL 500 MG PO TABS: 500.0000 mg | ORAL_TABLET | Freq: Every day | ORAL | Status: DC

## 2015-12-07 MED ORDER — MELOXICAM 15 MG PO TABS: 15.0000 mg | ORAL_TABLET | Freq: Every day | ORAL | Status: DC | PRN

## 2015-12-07 MED ORDER — ESOMEPRAZOLE MAGNESIUM 40 MG PO CPDR: 40.0000 mg | DELAYED_RELEASE_CAPSULE | Freq: Every day | ORAL | Status: DC

## 2015-12-07 MED ORDER — AMLODIPINE BESYLATE 5 MG PO TABS: 5.0000 mg | ORAL_TABLET | Freq: Every day | ORAL | Status: DC

## 2015-12-07 MED ORDER — TRIAMCINOLONE ACETONIDE 0.5 % EX CREA: TOPICAL_CREAM | Freq: Three times a day (TID) | CUTANEOUS | Status: DC

## 2015-12-07 MED ORDER — CLOTRIMAZOLE-BETAMETHASONE 1-0.05 % EX CREA: TOPICAL_CREAM | Freq: Two times a day (BID) | CUTANEOUS | Status: DC

## 2015-12-07 NOTE — Assessment & Plan Note (Signed)
B pain Dr Berenice Primas

## 2015-12-07 NOTE — Progress Notes (Signed)
Subjective:  Patient ID: Jerry Cohen, male    DOB: Jun 14, 1956  Age: 60 y.o. MRN: [id number]  CC: No chief complaint on file.   HPI Jerry Cohen presents for HTN, GERD, DM f/u C/o B shoulder pain L>R - h/o rotator cuff repair (Dr Jerry Cohen) - L handed C/o aces in B calves  Outpatient Prescriptions Prior to Visit  Medication Sig Dispense Refill  . amLODipine (NORVASC) 5 MG tablet TAKE 1 TABLET (5 MG TOTAL) BY MOUTH DAILY. 90 tablet 1  . aspirin 81 MG EC tablet Take 81 mg by mouth daily.      . Cholecalciferol (EQL VITAMIN D3) 1000 UNITS tablet Take 1,000 Units by mouth daily.      . clotrimazole-betamethasone (LOTRISONE) cream Apply topically 2 (two) times daily. 45 g 2  . esomeprazole (NEXIUM) 40 MG capsule Take 1 capsule (40 mg total) by mouth daily. 90 capsule 3  . fish oil-omega-3 fatty acids 1000 MG capsule Take 2 g by mouth 2 (two) times daily.      . metFORMIN (GLUCOPHAGE) 500 MG tablet TAKE 1 TABLET (500 MG TOTAL) BY MOUTH DAILY WITH BREAKFAST... MAX OF 30 DAYS ON INSURANCE 90 tablet 2  . rOPINIRole (REQUIP) 1 MG tablet TAKE 1 TABLET BY MOUTH AT BEDTIME AS NEEDED FOR RESTLESS LEGS... MAX OF 30 DAYS ON INSURANCE 90 tablet 3  . triamcinolone cream (KENALOG) 0.5 % Apply topically 3 (three) times daily. 30 g 3  . valsartan-hydrochlorothiazide (DIOVAN-HCT) 320-25 MG tablet TAKE 1 TABLET BY MOUTH DAILY. 90 tablet 3   No facility-administered medications prior to visit.    ROS Review of Systems  Constitutional: Positive for unexpected weight change. Negative for appetite change and fatigue.  HENT: Negative for congestion, nosebleeds, sneezing, sore throat and trouble swallowing.   Eyes: Negative for itching and visual disturbance.  Respiratory: Negative for cough.   Cardiovascular: Negative for chest pain, palpitations and leg swelling.  Gastrointestinal: Negative for nausea, diarrhea, blood in stool and abdominal distention.  Genitourinary: Negative for frequency and  hematuria.  Musculoskeletal: Positive for arthralgias. Negative for back pain, joint swelling, gait problem and neck pain.  Skin: Negative for rash.  Neurological: Negative for dizziness, tremors, speech difficulty and weakness.  Psychiatric/Behavioral: Negative for sleep disturbance, dysphoric mood and agitation. The patient is not nervous/anxious.     Objective:  BP 136/78 mmHg  Pulse 62  Wt 241 lb (109.317 kg)  SpO2 95%  BP Readings from Last 3 Encounters:  12/07/15 136/78  06/08/15 100/64  01/08/15 94/46    Wt Readings from Last 3 Encounters:  12/07/15 241 lb (109.317 kg)  06/08/15 237 lb (107.502 kg)  01/08/15 234 lb (106.142 kg)    Physical Exam  Constitutional: He is oriented to person, place, and time. He appears well-developed. No distress.  NAD  HENT:  Mouth/Throat: Oropharynx is clear and moist.  Eyes: Conjunctivae are normal. Pupils are equal, round, and reactive to light.  Neck: Normal range of motion. No JVD present. No thyromegaly present.  Cardiovascular: Normal rate, regular rhythm, normal heart sounds and intact distal pulses.  Exam reveals no gallop and no friction rub.   No murmur heard. Pulmonary/Chest: Effort normal and breath sounds normal. No respiratory distress. He has no wheezes. He has no rales. He exhibits no tenderness.  Abdominal: Soft. Bowel sounds are normal. He exhibits no distension and no mass. There is no tenderness. There is no rebound and no guarding.  Musculoskeletal: Normal range of motion. He exhibits  tenderness. He exhibits no edema.  Lymphadenopathy:    He has no cervical adenopathy.  Neurological: He is alert and oriented to person, place, and time. He has normal reflexes. No cranial nerve deficit. He exhibits normal muscle tone. He displays a negative Romberg sign. Coordination and gait normal.  Skin: Skin is warm and dry. No rash noted.  Psychiatric: He has a normal mood and affect. His behavior is normal. Judgment and thought  content normal.  Shoulders are tender w/ROM L>R  Lab Results  Component Value Date   WBC 8.9 06/08/2015   HGB 16.6 06/08/2015   HCT 48.4 06/08/2015   PLT 274.0 06/08/2015   GLUCOSE 109* 06/08/2015   CHOL 204* 06/08/2015   TRIG 167.0* 06/08/2015   HDL 31.30* 06/08/2015   LDLDIRECT 147.0 12/07/2014   LDLCALC 140* 06/08/2015   ALT 73* 06/08/2015   AST 37 06/08/2015   NA 140 06/08/2015   K 3.8 06/08/2015   CL 104 06/08/2015   CREATININE 0.92 06/08/2015   BUN 14 06/08/2015   CO2 27 06/08/2015   TSH 1.26 06/08/2015   PSA 0.41 06/08/2015   HGBA1C 6.1 06/08/2015    US Abdomen Complete  09/19/2013  CLINICAL DATA:  Elevated LFTs. EXAM: ULTRASOUND ABDOMEN COMPLETE COMPARISON:  None. FINDINGS: Gallbladder: No gallstones or wall thickening visualized. No sonographic Murphy sign noted. Common bile duct: Diameter: Normal caliber, 3 mm Liver: Diffusely increased echotexture compatible with fatty infiltration or intrinsic liver disease. Node visible focal abnormality or biliary ductal dilatation. IVC: Not well visualized due to overlying bowel gas. Pancreas: Not well visualized due to overlying bowel gas. Spleen: Size and appearance within normal limits. Right Kidney: Length: 12.4 cm. Echogenicity within normal limits. No mass or hydronephrosis visualized. Left Kidney: Length: 12.8 cm. Echogenicity within normal limits. No mass or hydronephrosis visualized. Abdominal aorta: No aneurysm visualized. Other findings: None. IMPRESSION: Diffusely increased echotexture throughout the liver compatible with fatty infiltration or intrinsic liver disease. Electronically Signed   By: Jerry Cohen M.D.   On: 09/19/2013 10:42    Assessment & Plan:   There are no diagnoses linked to this encounter. I am having Mr. Jerry Cohen maintain his aspirin, fish oil-omega-3 fatty acids, Cholecalciferol, clotrimazole-betamethasone, esomeprazole, triamcinolone cream, metFORMIN, rOPINIRole, valsartan-hydrochlorothiazide, and  amLODipine.  No orders of the defined types were placed in this encounter.     Follow-up: No Follow-up on file.  Walker Kehr, MD

## 2015-12-07 NOTE — Assessment & Plan Note (Signed)
L>R B Will ref to Dr Berenice Primas

## 2015-12-07 NOTE — Assessment & Plan Note (Signed)
Labs On Metformin Labs

## 2015-12-07 NOTE — Assessment & Plan Note (Signed)
On Norvasc and Diovan HCT 

## 2015-12-07 NOTE — Progress Notes (Signed)
Pre visit review using our clinic review tool, if applicable. No additional management support is needed unless otherwise documented below in the visit note. 

## 2016-02-03 ENCOUNTER — Other Ambulatory Visit: Payer: Self-pay | Admitting: Orthopaedic Surgery

## 2016-02-03 DIAGNOSIS — M7542 Impingement syndrome of left shoulder: Secondary | ICD-10-CM

## 2016-02-11 ENCOUNTER — Other Ambulatory Visit: Payer: Self-pay | Admitting: Orthopaedic Surgery

## 2016-02-11 ENCOUNTER — Ambulatory Visit
Admission: RE | Admit: 2016-02-11 | Discharge: 2016-02-11 | Disposition: A | Discharge status: Home / Self Care | Payer: PRIVATE HEALTH INSURANCE | Source: Ambulatory Visit | Attending: Orthopaedic Surgery | Admitting: Orthopaedic Surgery

## 2016-02-11 DIAGNOSIS — Z77018 Contact with and (suspected) exposure to other hazardous metals: Secondary | ICD-10-CM

## 2016-02-11 DIAGNOSIS — M7542 Impingement syndrome of left shoulder: Secondary | ICD-10-CM

## 2016-02-13 ENCOUNTER — Ambulatory Visit
Admission: RE | Admit: 2016-02-13 | Discharge: 2016-02-13 | Disposition: A | Discharge status: Home / Self Care | Payer: PRIVATE HEALTH INSURANCE | Source: Ambulatory Visit | Attending: Orthopaedic Surgery | Admitting: Orthopaedic Surgery

## 2016-02-13 DIAGNOSIS — M7542 Impingement syndrome of left shoulder: Secondary | ICD-10-CM

## 2016-02-18 ENCOUNTER — Other Ambulatory Visit: Payer: Self-pay | Admitting: Internal Medicine

## 2016-04-24 ENCOUNTER — Other Ambulatory Visit: Payer: Self-pay | Admitting: Internal Medicine

## 2016-06-06 ENCOUNTER — Ambulatory Visit (INDEPENDENT_AMBULATORY_CARE_PROVIDER_SITE_OTHER): Payer: PRIVATE HEALTH INSURANCE | Admitting: Internal Medicine

## 2016-06-06 ENCOUNTER — Other Ambulatory Visit (INDEPENDENT_AMBULATORY_CARE_PROVIDER_SITE_OTHER): Payer: PRIVATE HEALTH INSURANCE

## 2016-06-06 ENCOUNTER — Encounter: Payer: Self-pay | Admitting: Internal Medicine

## 2016-06-06 DIAGNOSIS — K219 Gastro-esophageal reflux disease without esophagitis: Secondary | ICD-10-CM | POA: Diagnosis not present

## 2016-06-06 DIAGNOSIS — I10 Essential (primary) hypertension: Secondary | ICD-10-CM

## 2016-06-06 DIAGNOSIS — R42 Dizziness and giddiness: Secondary | ICD-10-CM | POA: Diagnosis not present

## 2016-06-06 DIAGNOSIS — R635 Abnormal weight gain: Secondary | ICD-10-CM | POA: Diagnosis not present

## 2016-06-06 DIAGNOSIS — M2550 Pain in unspecified joint: Secondary | ICD-10-CM

## 2016-06-06 LAB — BASIC METABOLIC PANEL
BUN: 14 mg/dL (ref 6–23)
CO2: 27 mEq/L (ref 19–32)
Calcium: 9.7 mg/dL (ref 8.4–10.5)
Chloride: 102 mEq/L (ref 96–112)
Creatinine, Ser: 1.06 mg/dL (ref 0.40–1.50)
GFR: 75.11 mL/min (ref >60.00)
Glucose, Bld: 129 mg/dL — ABNORMAL HIGH (ref 70–99)
POTASSIUM: 3.7 meq/L (ref 3.5–5.1)
Sodium: 139 mEq/L (ref 135–145)

## 2016-06-06 LAB — HEPATIC FUNCTION PANEL
ALT: 135 U/L — ABNORMAL HIGH (ref 0–53)
AST: 82 U/L — AB (ref 0–37)
Albumin: 4.4 g/dL (ref 3.5–5.2)
Alkaline Phosphatase: 53 U/L (ref 39–117)
BILIRUBIN TOTAL: 2 mg/dL — AB (ref 0.2–1.2)
Bilirubin, Direct: 0.3 mg/dL (ref 0.0–0.3)
Total Protein: 7.4 g/dL (ref 6.0–8.3)

## 2016-06-06 NOTE — Progress Notes (Signed)
Subjective:  Patient ID: Jerry Cohen, male    DOB: 09-01-1955  Age: 60 y.o. MRN: [id number]  CC: No chief complaint on file.   HPI BRAXEN BOATNER presents for HTN, GERD and OA f/u. He has been working 70h/wk  Outpatient Medications Prior to Visit  Medication Sig Dispense Refill  . amLODipine (NORVASC) 5 MG tablet Take 1 tablet (5 mg total) by mouth daily. 90 tablet 3  . aspirin 81 MG EC tablet Take 81 mg by mouth daily.      . Cholecalciferol (EQL VITAMIN D3) 1000 UNITS tablet Take 1,000 Units by mouth daily.      . clotrimazole-betamethasone (LOTRISONE) cream Apply topically 2 (two) times daily. 45 g 2  . esomeprazole (NEXIUM) 40 MG capsule Take 1 capsule (40 mg total) by mouth daily. 90 capsule 3  . fish oil-omega-3 fatty acids 1000 MG capsule Take 2 g by mouth 2 (two) times daily.      . meloxicam (MOBIC) 15 MG tablet TAKE 1 TABLET (15 MG TOTAL) BY MOUTH DAILY AS NEEDED FOR PAIN (PC). 30 tablet 1  . metFORMIN (GLUCOPHAGE) 500 MG tablet Take 1 tablet (500 mg total) by mouth daily with breakfast. 90 tablet 3  . rOPINIRole (REQUIP) 1 MG tablet TAKE 1 TABLET BY MOUTH AT BEDTIME AS NEEDED FOR RESTLESS LEGS... MAX OF 30 DAYS ON INSURANCE 90 tablet 3  . triamcinolone cream (KENALOG) 0.5 % Apply topically 3 (three) times daily. 30 g 3  . valsartan-hydrochlorothiazide (DIOVAN-HCT) 320-25 MG tablet Take 1 tablet by mouth daily. 90 tablet 3   No facility-administered medications prior to visit.     ROS Review of Systems  Constitutional: Negative for appetite change, fatigue and unexpected weight change.  HENT: Negative for congestion, nosebleeds, sneezing, sore throat and trouble swallowing.   Eyes: Negative for itching and visual disturbance.  Respiratory: Negative for cough.   Cardiovascular: Negative for chest pain, palpitations and leg swelling.  Gastrointestinal: Negative for abdominal distention, blood in stool, diarrhea and nausea.  Genitourinary: Negative for frequency and  hematuria.  Musculoskeletal: Positive for arthralgias. Negative for back pain, gait problem, joint swelling and neck pain.  Skin: Negative for rash.  Neurological: Negative for dizziness, tremors, speech difficulty and weakness.  Psychiatric/Behavioral: Negative for agitation, dysphoric mood and sleep disturbance. The patient is not nervous/anxious.     Objective:  BP 136/74   Pulse 67   Temp 98.7 F (37.1 C) (Oral)   Resp 20   Wt 238 lb (108 kg)   SpO2 95%   BMI 38.41 kg/m   BP Readings from Last 3 Encounters:  06/06/16 136/74  12/07/15 136/78  06/08/15 100/64    Wt Readings from Last 3 Encounters:  06/06/16 238 lb (108 kg)  12/07/15 241 lb (109.3 kg)  06/08/15 237 lb (107.5 kg)    Physical Exam  Constitutional: He is oriented to person, place, and time. He appears well-developed. No distress.  NAD  HENT:  Mouth/Throat: Oropharynx is clear and moist.  Eyes: Conjunctivae are normal. Pupils are equal, round, and reactive to light.  Neck: Normal range of motion. No JVD present. No thyromegaly present.  Cardiovascular: Normal rate, regular rhythm, normal heart sounds and intact distal pulses.  Exam reveals no gallop and no friction rub.   No murmur heard. Pulmonary/Chest: Effort normal and breath sounds normal. No respiratory distress. He has no wheezes. He has no rales. He exhibits no tenderness.  Abdominal: Soft. Bowel sounds are normal. He exhibits no  distension and no mass. There is no tenderness. There is no rebound and no guarding.  Musculoskeletal: Normal range of motion. He exhibits no edema or tenderness.  Lymphadenopathy:    He has no cervical adenopathy.  Neurological: He is alert and oriented to person, place, and time. He has normal reflexes. No cranial nerve deficit. He exhibits normal muscle tone. He displays a negative Romberg sign. Coordination and gait normal.  Skin: Skin is warm and dry. No rash noted.  Psychiatric: He has a normal mood and affect. His  behavior is normal. Judgment and thought content normal.  obese  Lab Results  Component Value Date   WBC 7.6 12/07/2015   HGB 15.1 12/07/2015   HCT 44.5 12/07/2015   PLT 260.0 12/07/2015   GLUCOSE 122 (H) 12/07/2015   CHOL 204 (H) 06/08/2015   TRIG 167.0 (H) 06/08/2015   HDL 31.30 (L) 06/08/2015   LDLDIRECT 147.0 12/07/2014   LDLCALC 140 (H) 06/08/2015   ALT 74 (H) 12/07/2015   AST 38 (H) 12/07/2015   NA 141 12/07/2015   K 3.9 12/07/2015   CL 107 12/07/2015   CREATININE 1.05 12/07/2015   BUN 13 12/07/2015   CO2 28 12/07/2015   TSH 1.26 06/08/2015   PSA 0.41 06/08/2015   HGBA1C 6.0 12/07/2015    Mr Shoulder Left Wo Contrast  Result Date: 02/13/2016 CLINICAL DATA:  60 year old with left shoulder pain for 2 months. Shoulder surgery 7 years ago. No recent injury. EXAM: MRI OF THE LEFT SHOULDER WITHOUT CONTRAST TECHNIQUE: Multiplanar, multisequence MR imaging of the shoulder was performed. No intravenous contrast was administered. COMPARISON:  Left shoulder MR arthrogram 11/18/2008 FINDINGS: Postsurgical changes in the humeral head status post rotator cuff repair are grossly stable. No definite interval surgical changes are identified. Rotator cuff: The anchor screws within the greater tuberosity are unchanged in position. There is progressive supraspinatus and infraspinatus tendinosis. There is a small full-thickness, non retracted insertional tear of the supraspinatus tendon anteriorly, best seen on sagittal image 18. The subscapularis tendon is chronically attenuated without acute abnormality. The teres minor tendon appears normal. Muscles: Chronic subscapularis muscular atrophy. The additional components of the rotator cuff appear unremarkable. Biceps long head: Chronically ruptured versus previous tenotomy. Unchanged from previous study. Acromioclavicular Joint: The acromion is type 2. There are stable acromioclavicular degenerative changes with fluid in the acromioclavicular joint. A  small amount fluid is present in the subacromial -subdeltoid bursa. Glenohumeral Joint: No significant joint effusion. Stable mild glenohumeral degenerative changes. Labrum: Stable superior labral blunting, likely from previous debridement. No labral tear or paralabral cyst identified. Bones: No acute or significant extra-articular osseous findings. Other: No significant soft tissue findings. IMPRESSION: 1. Stable postsurgical changes from previous rotator cuff repair. 2. There is progressive rotator cuff tendinosis with a small full thickness insertional tear of the supraspinatus tendon anteriorly. The subscapularis tendon is chronically attenuated with associated muscular atrophy. 3. 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 4. Stable acromioclavicular degenerative changes and joint effusion. Electronically Signed   By: Richardean Sale M.D.   On: 02/13/2016 13:04    Assessment & Plan:   There are no diagnoses linked to this encounter. I am having Mr. Frischman maintain his aspirin, fish oil-omega-3 fatty acids, Cholecalciferol, metFORMIN, esomeprazole, valsartan-hydrochlorothiazide, rOPINIRole, amLODipine, clotrimazole-betamethasone, triamcinolone cream, and meloxicam.  No orders of the defined types were placed in this encounter.    Follow-up: No Follow-up on file.  Walker Kehr, MD

## 2016-06-06 NOTE — Assessment & Plan Note (Signed)
Norvasc Diovan HCT

## 2016-06-06 NOTE — Progress Notes (Signed)
Pre visit review using our clinic review tool, if applicable. No additional management support is needed unless otherwise documented below in the visit note. 

## 2016-06-06 NOTE — Assessment & Plan Note (Signed)
No recurrence. 

## 2016-06-06 NOTE — Assessment & Plan Note (Signed)
Wt Readings from Last 3 Encounters:  06/06/16 238 lb (108 kg)  12/07/15 241 lb (109.3 kg)  06/08/15 237 lb (107.5 kg)

## 2016-06-06 NOTE — Assessment & Plan Note (Signed)
Advil Vit D Reduce work hours!

## 2016-06-06 NOTE — Assessment & Plan Note (Signed)
Nexium 

## 2016-06-21 ENCOUNTER — Other Ambulatory Visit: Payer: Self-pay | Admitting: Internal Medicine

## 2016-08-23 ENCOUNTER — Other Ambulatory Visit: Payer: Self-pay | Admitting: Internal Medicine

## 2016-10-24 ENCOUNTER — Other Ambulatory Visit: Payer: Self-pay | Admitting: Internal Medicine

## 2016-10-25 ENCOUNTER — Other Ambulatory Visit: Payer: Self-pay | Admitting: Internal Medicine

## 2016-12-05 ENCOUNTER — Other Ambulatory Visit: Payer: Self-pay | Admitting: Internal Medicine

## 2016-12-05 ENCOUNTER — Encounter: Payer: Self-pay | Admitting: Internal Medicine

## 2016-12-05 ENCOUNTER — Ambulatory Visit (INDEPENDENT_AMBULATORY_CARE_PROVIDER_SITE_OTHER): Payer: Commercial Managed Care - PPO | Admitting: Internal Medicine

## 2016-12-05 ENCOUNTER — Other Ambulatory Visit (INDEPENDENT_AMBULATORY_CARE_PROVIDER_SITE_OTHER): Payer: Commercial Managed Care - PPO

## 2016-12-05 VITALS — BP 126/72 | HR 56 | Temp 98.3°F | Ht 66.0 in | Wt 236.0 lb

## 2016-12-05 DIAGNOSIS — Z0000 Encounter for general adult medical examination without abnormal findings: Secondary | ICD-10-CM

## 2016-12-05 DIAGNOSIS — R945 Abnormal results of liver function studies: Secondary | ICD-10-CM

## 2016-12-05 DIAGNOSIS — I10 Essential (primary) hypertension: Secondary | ICD-10-CM | POA: Diagnosis not present

## 2016-12-05 DIAGNOSIS — Z86010 Personal history of colonic polyps: Secondary | ICD-10-CM | POA: Diagnosis not present

## 2016-12-05 DIAGNOSIS — R7989 Other specified abnormal findings of blood chemistry: Secondary | ICD-10-CM

## 2016-12-05 DIAGNOSIS — Z0001 Encounter for general adult medical examination with abnormal findings: Secondary | ICD-10-CM | POA: Diagnosis not present

## 2016-12-05 LAB — LIPID PANEL
CHOLESTEROL: 184 mg/dL (ref 0–200)
HDL: 25.8 mg/dL — AB (ref >39.00)
NonHDL: 158.38
TRIGLYCERIDES: 256 mg/dL — AB (ref 0.0–149.0)
Total CHOL/HDL Ratio: 7
VLDL: 51.2 mg/dL — ABNORMAL HIGH (ref 0.0–40.0)

## 2016-12-05 LAB — URINALYSIS
Bilirubin Urine: NEGATIVE
Hgb urine dipstick: NEGATIVE
KETONES UR: NEGATIVE
LEUKOCYTES UA: NEGATIVE
Nitrite: NEGATIVE
PH: 6 (ref 5.0–8.0)
SPECIFIC GRAVITY, URINE: 1.025 (ref 1.000–1.030)
Total Protein, Urine: NEGATIVE
UROBILINOGEN UA: 0.2 (ref 0.0–1.0)
Urine Glucose: NEGATIVE

## 2016-12-05 LAB — CBC WITH DIFFERENTIAL/PLATELET
BASOS ABS: 0.1 10*3/uL (ref 0.0–0.1)
Basophils Relative: 1.1 % (ref 0.0–3.0)
EOS PCT: 1.4 % (ref 0.0–5.0)
Eosinophils Absolute: 0.1 10*3/uL (ref 0.0–0.7)
HEMATOCRIT: 46.7 % (ref 39.0–52.0)
Hemoglobin: 16.2 g/dL (ref 13.0–17.0)
LYMPHS ABS: 2 10*3/uL (ref 0.7–4.0)
LYMPHS PCT: 26.2 % (ref 12.0–46.0)
MCHC: 34.6 g/dL (ref 30.0–36.0)
MCV: 91.2 fl (ref 78.0–100.0)
MONOS PCT: 9 % (ref 3.0–12.0)
Monocytes Absolute: 0.7 10*3/uL (ref 0.1–1.0)
NEUTROS ABS: 4.7 10*3/uL (ref 1.4–7.7)
NEUTROS PCT: 62.3 % (ref 43.0–77.0)
PLATELETS: 250 10*3/uL (ref 150.0–400.0)
RBC: 5.12 Mil/uL (ref 4.22–5.81)
RDW: 13 % (ref 11.5–15.5)
WBC: 7.6 10*3/uL (ref 4.0–10.5)

## 2016-12-05 LAB — BASIC METABOLIC PANEL
BUN: 13 mg/dL (ref 6–23)
CALCIUM: 9.9 mg/dL (ref 8.4–10.5)
CHLORIDE: 106 meq/L (ref 96–112)
CO2: 27 meq/L (ref 19–32)
CREATININE: 0.95 mg/dL (ref 0.40–1.50)
GFR: 85.1 mL/min (ref >60.00)
Glucose, Bld: 123 mg/dL — ABNORMAL HIGH (ref 70–99)
Potassium: 4.2 mEq/L (ref 3.5–5.1)
Sodium: 139 mEq/L (ref 135–145)

## 2016-12-05 LAB — LDL CHOLESTEROL, DIRECT: Direct LDL: 121 mg/dL

## 2016-12-05 LAB — HEPATIC FUNCTION PANEL
ALBUMIN: 4.2 g/dL (ref 3.5–5.2)
ALT: 67 U/L — AB (ref 0–53)
AST: 39 U/L — ABNORMAL HIGH (ref 0–37)
Alkaline Phosphatase: 56 U/L (ref 39–117)
Bilirubin, Direct: 0.2 mg/dL (ref 0.0–0.3)
TOTAL PROTEIN: 6.7 g/dL (ref 6.0–8.3)
Total Bilirubin: 1.6 mg/dL — ABNORMAL HIGH (ref 0.2–1.2)

## 2016-12-05 LAB — TSH: TSH: 1.16 u[IU]/mL (ref 0.35–4.50)

## 2016-12-05 LAB — PSA: PSA: 0.31 ng/mL (ref 0.10–4.00)

## 2016-12-05 MED ORDER — ICOSAPENT ETHYL 1 G PO CAPS: 2.0000 | ORAL_CAPSULE | Freq: Two times a day (BID) | ORAL | 11 refills | Status: DC

## 2016-12-05 NOTE — Assessment & Plan Note (Signed)
Labs Pt will discuss w/Dr Fuller Plan

## 2016-12-05 NOTE — Assessment & Plan Note (Signed)
Colon pending soon

## 2016-12-05 NOTE — Patient Instructions (Signed)
Try Turmeric tablets

## 2016-12-05 NOTE — Assessment & Plan Note (Signed)
We discussed age appropriate health related issues, including available/recomended screening tests and vaccinations. We discussed a need for adhering to healthy diet and exercise. Labs were ordered to be later reviewed . All questions were answered. Pt declined Shingrix 

## 2016-12-05 NOTE — Progress Notes (Signed)
Subjective:  Patient ID: Jerry Cohen, male    DOB: 1956-05-20  Age: 61 y.o. MRN: [id number]  CC: No chief complaint on file.   HPI Jerry Cohen presents for a well exam. Retiring and moving to [facility] in Dec 2018  Outpatient Medications Prior to Visit  Medication Sig Dispense Refill  . amLODipine (NORVASC) 5 MG tablet Take 1 tablet (5 mg total) by mouth daily. 90 tablet 3  . aspirin 81 MG EC tablet Take 81 mg by mouth daily.      . Cholecalciferol (EQL VITAMIN D3) 1000 UNITS tablet Take 1,000 Units by mouth daily.      . clotrimazole-betamethasone (LOTRISONE) cream Apply topically 2 (two) times daily. 45 g 2  . esomeprazole (NEXIUM) 40 MG capsule TAKE 1 CAPSULE (40 MG TOTAL) BY MOUTH DAILY.INS ALLOWS 30 DAYS 90 capsule 1  . fish oil-omega-3 fatty acids 1000 MG capsule Take 2 g by mouth 2 (two) times daily.      . meloxicam (MOBIC) 15 MG tablet TAKE 1 TABLET (15 MG TOTAL) BY MOUTH DAILY AS NEEDED FOR PAIN (PC). 30 tablet 1  . metFORMIN (GLUCOPHAGE) 500 MG tablet Take 1 tablet (500 mg total) by mouth daily with breakfast. 90 tablet 3  . rOPINIRole (REQUIP) 1 MG tablet TAKE 1 TABLET BY MOUTH AT BEDTIME AS NEEDED FOR RESTLESS LEGS... MAX OF 30 DAYS ON INSURANCE 90 tablet 3  . triamcinolone cream (KENALOG) 0.5 % Apply topically 3 (three) times daily. 30 g 3  . valsartan-hydrochlorothiazide (DIOVAN-HCT) 320-25 MG tablet Take 1 tablet by mouth daily. 90 tablet 3   No facility-administered medications prior to visit.     ROS Review of Systems  Constitutional: Negative for appetite change, fatigue and unexpected weight change.  HENT: Negative for congestion, nosebleeds, sneezing, sore throat and trouble swallowing.   Eyes: Negative for itching and visual disturbance.  Respiratory: Negative for cough.   Cardiovascular: Negative for chest pain, palpitations and leg swelling.  Gastrointestinal: Negative for abdominal distention, blood in stool, diarrhea and nausea.  Genitourinary:  Negative for frequency and hematuria.  Musculoskeletal: Positive for arthralgias. Negative for back pain, gait problem, joint swelling and neck pain.  Skin: Negative for rash.  Neurological: Negative for dizziness, tremors, speech difficulty and weakness.  Psychiatric/Behavioral: Negative for agitation, dysphoric mood and sleep disturbance. The patient is not nervous/anxious.     Objective:  BP 126/72 (BP Location: Left Arm, Patient Position: Sitting, Cuff Size: Large)   Pulse (!) 56   Temp 98.3 F (36.8 C) (Oral)   Ht 5\' 6"  (1.676 m)   Wt 236 lb (107 kg)   SpO2 98%   BMI 38.09 kg/m   BP Readings from Last 3 Encounters:  12/05/16 126/72  06/06/16 136/74  12/07/15 136/78    Wt Readings from Last 3 Encounters:  12/05/16 236 lb (107 kg)  06/06/16 238 lb (108 kg)  12/07/15 241 lb (109.3 kg)    Physical Exam  Constitutional: He is oriented to person, place, and time. He appears well-developed and well-nourished. No distress.  HENT:  Head: Normocephalic and atraumatic.  Right Ear: External ear normal.  Left Ear: External ear normal.  Nose: Nose normal.  Mouth/Throat: Oropharynx is clear and moist. No oropharyngeal exudate.  Eyes: Conjunctivae and EOM are normal. Pupils are equal, round, and reactive to light. Right eye exhibits no discharge. Left eye exhibits no discharge. No scleral icterus.  Neck: Normal range of motion. Neck supple. No JVD present. No tracheal  deviation present. No thyromegaly present.  Cardiovascular: Normal rate, regular rhythm, normal heart sounds and intact distal pulses.  Exam reveals no gallop and no friction rub.   No murmur heard. Pulmonary/Chest: Effort normal and breath sounds normal. No stridor. No respiratory distress. He has no wheezes. He has no rales. He exhibits no tenderness.  Abdominal: Soft. Bowel sounds are normal. He exhibits no distension and no mass. There is no tenderness. There is no rebound and no guarding.  Musculoskeletal: Normal  range of motion. He exhibits no edema or tenderness.  Lymphadenopathy:    He has no cervical adenopathy.  Neurological: He is alert and oriented to person, place, and time. He has normal reflexes. No cranial nerve deficit. He exhibits normal muscle tone. Coordination normal.  Skin: Skin is warm and dry. No rash noted. He is not diaphoretic. No erythema. No pallor.  Psychiatric: He has a normal mood and affect. His behavior is normal. Judgment and thought content normal.  obese Pt declined rectal - colonosc in 203 mo (per GI)  Procedure: EKG Indication: well exam, HTN Impression: NSR. No acute changes.   Lab Results  Component Value Date   WBC 7.6 12/07/2015   HGB 15.1 12/07/2015   HCT 44.5 12/07/2015   PLT 260.0 12/07/2015   GLUCOSE 129 (H) 06/06/2016   CHOL 204 (H) 06/08/2015   TRIG 167.0 (H) 06/08/2015   HDL 31.30 (L) 06/08/2015   LDLDIRECT 147.0 12/07/2014   LDLCALC 140 (H) 06/08/2015   ALT 135 (H) 06/06/2016   AST 82 (H) 06/06/2016   NA 139 06/06/2016   K 3.7 06/06/2016   CL 102 06/06/2016   CREATININE 1.06 06/06/2016   BUN 14 06/06/2016   CO2 27 06/06/2016   TSH 1.26 06/08/2015   PSA 0.41 06/08/2015   HGBA1C 6.0 12/07/2015    Mr Shoulder Left Wo Contrast  Result Date: 02/13/2016 CLINICAL DATA:  61 year old with left shoulder pain for 2 months. Shoulder surgery 7 years ago. No recent injury. EXAM: MRI OF THE LEFT SHOULDER WITHOUT CONTRAST TECHNIQUE: Multiplanar, multisequence MR imaging of the shoulder was performed. No intravenous contrast was administered. COMPARISON:  Left shoulder MR arthrogram 11/18/2008 FINDINGS: Postsurgical changes in the humeral head status post rotator cuff repair are grossly stable. No definite interval surgical changes are identified. Rotator cuff: The anchor screws within the greater tuberosity are unchanged in position. There is progressive supraspinatus and infraspinatus tendinosis. There is a small full-thickness, non retracted insertional  tear of the supraspinatus tendon anteriorly, best seen on sagittal image 18. The subscapularis tendon is chronically attenuated without acute abnormality. The teres minor tendon appears normal. Muscles: Chronic subscapularis muscular atrophy. The additional components of the rotator cuff appear unremarkable. Biceps long head: Chronically ruptured versus previous tenotomy. Unchanged from previous study. Acromioclavicular Joint: The acromion is type 2. There are stable acromioclavicular degenerative changes with fluid in the acromioclavicular joint. A small amount fluid is present in the subacromial -subdeltoid bursa. Glenohumeral Joint: No significant joint effusion. Stable mild glenohumeral degenerative changes. Labrum: Stable superior labral blunting, likely from previous debridement. No labral tear or paralabral cyst identified. Bones: No acute or significant extra-articular osseous findings. Other: No significant soft tissue findings. IMPRESSION: 1. Stable postsurgical changes from previous rotator cuff repair. 2. There is progressive rotator cuff tendinosis with a small full thickness insertional tear of the supraspinatus tendon anteriorly. The subscapularis tendon is chronically attenuated with associated muscular atrophy. 3. 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 4. Stable acromioclavicular degenerative changes and joint effusion. Electronically Signed   By: Richardean Sale M.D.   On: 02/13/2016 13:04    Assessment & Plan:   There are no diagnoses linked to this encounter. I am having Mr. Peets maintain his aspirin, fish oil-omega-3 fatty acids, Cholecalciferol, metFORMIN, valsartan-hydrochlorothiazide, rOPINIRole, amLODipine, clotrimazole-betamethasone, triamcinolone cream, meloxicam, and esomeprazole.  No orders of the defined types were placed in this encounter.    Follow-up: No Follow-up on file.  Walker Kehr, MD

## 2016-12-05 NOTE — Assessment & Plan Note (Addendum)
BP Readings from Last 3 Encounters:  12/05/16 126/72  06/06/16 136/74  12/07/15 136/78  EKG Cont w/meds

## 2016-12-06 LAB — IRON,TIBC AND FERRITIN PANEL
%SAT: 46 % (ref 15–60)
FERRITIN: 187 ng/mL (ref 20–380)
IRON: 155 ug/dL (ref 50–180)
TIBC: 334 ug/dL (ref 250–425)

## 2016-12-24 ENCOUNTER — Other Ambulatory Visit: Payer: Self-pay | Admitting: Internal Medicine

## 2017-03-13 ENCOUNTER — Other Ambulatory Visit: Payer: Self-pay | Admitting: Internal Medicine

## 2017-03-20 ENCOUNTER — Other Ambulatory Visit: Payer: Self-pay | Admitting: Internal Medicine

## 2017-04-18 DIAGNOSIS — G4730 Sleep apnea, unspecified: Secondary | ICD-10-CM | POA: Diagnosis not present

## 2017-06-06 ENCOUNTER — Ambulatory Visit: Payer: PRIVATE HEALTH INSURANCE | Admitting: Internal Medicine

## 2017-06-29 ENCOUNTER — Encounter: Payer: Self-pay | Admitting: Internal Medicine

## 2017-06-29 ENCOUNTER — Ambulatory Visit: Payer: Commercial Managed Care - PPO | Admitting: Internal Medicine

## 2017-06-29 DIAGNOSIS — R945 Abnormal results of liver function studies: Secondary | ICD-10-CM | POA: Diagnosis not present

## 2017-06-29 DIAGNOSIS — R7989 Other specified abnormal findings of blood chemistry: Secondary | ICD-10-CM

## 2017-06-29 DIAGNOSIS — G4733 Obstructive sleep apnea (adult) (pediatric): Secondary | ICD-10-CM

## 2017-06-29 DIAGNOSIS — R739 Hyperglycemia, unspecified: Secondary | ICD-10-CM

## 2017-06-29 DIAGNOSIS — E785 Hyperlipidemia, unspecified: Secondary | ICD-10-CM | POA: Diagnosis not present

## 2017-06-29 DIAGNOSIS — G2581 Restless legs syndrome: Secondary | ICD-10-CM

## 2017-06-29 DIAGNOSIS — I10 Essential (primary) hypertension: Secondary | ICD-10-CM

## 2017-06-29 MED ORDER — ROPINIROLE HCL 1 MG PO TABS: ORAL_TABLET | ORAL | 3 refills | Status: DC

## 2017-06-29 NOTE — Assessment & Plan Note (Addendum)
Neurol ref - Dr Brett Fairy Requip

## 2017-06-29 NOTE — Assessment & Plan Note (Signed)
LFTs Wt loss

## 2017-06-29 NOTE — Assessment & Plan Note (Signed)
Labs

## 2017-06-29 NOTE — Assessment & Plan Note (Signed)
A1c

## 2017-06-29 NOTE — Progress Notes (Signed)
Subjective:  Patient ID: Jerry Cohen, male    DOB: Nov 12, 1955  Age: 62 y.o. MRN: [id number]  CC: No chief complaint on file.   HPI Jerry Cohen presents for elev LFTs, dyslipidemia, DM f/u C/o skin lesions C/o snoring/OSA - he would like to investigate surgical options. F/u RLS  Outpatient Medications Prior to Visit  Medication Sig Dispense Refill  . amLODipine (NORVASC) 5 MG tablet Take 1 tablet (5 mg total) by mouth daily. 90 tablet 3  . aspirin 81 MG EC tablet Take 81 mg by mouth daily.      . Cholecalciferol (EQL VITAMIN D3) 1000 UNITS tablet Take 1,000 Units by mouth daily.      . clotrimazole-betamethasone (LOTRISONE) cream Apply topically 2 (two) times daily. 45 g 2  . esomeprazole (NEXIUM) 40 MG capsule TAKE 1 CAPSULE (40 MG TOTAL) BY MOUTH DAILY.INS ALLOWS 30 DAYS 90 capsule 1  . Icosapent Ethyl (VASCEPA) 1 g CAPS Take 2 capsules by mouth 2 (two) times daily. 120 capsule 11  . meloxicam (MOBIC) 15 MG tablet TAKE 1 TABLET (15 MG TOTAL) BY MOUTH DAILY AS NEEDED FOR PAIN (PC). 30 tablet 1  . metFORMIN (GLUCOPHAGE) 500 MG tablet Take 1 tablet (500 mg total) by mouth daily with breakfast. 90 tablet 3  . rOPINIRole (REQUIP) 1 MG tablet TAKE 1 TABLET BY MOUTH AT BEDTIME AS NEEDED FOR RESTLESS LEGS... MAX OF 30 DAYS ON INSURANCE 30 tablet 3  . triamcinolone cream (KENALOG) 0.5 % APPLY TOPICALLY 3 (THREE) TIMES DAILY. 30 g 0  . valsartan-hydrochlorothiazide (DIOVAN-HCT) 320-25 MG tablet Take 1 tablet by mouth daily. 90 tablet 3   No facility-administered medications prior to visit.     ROS Review of Systems  Constitutional: Negative for appetite change, fatigue and unexpected weight change.  HENT: Negative for congestion, nosebleeds, sneezing, sore throat and trouble swallowing.   Eyes: Negative for itching and visual disturbance.  Respiratory: Negative for cough.   Cardiovascular: Negative for chest pain, palpitations and leg swelling.  Gastrointestinal: Negative for  abdominal distention, blood in stool, diarrhea and nausea.  Genitourinary: Negative for frequency and hematuria.  Musculoskeletal: Negative for back pain, gait problem, joint swelling and neck pain.  Skin: Negative for rash.  Neurological: Negative for dizziness, tremors, speech difficulty and weakness.  Psychiatric/Behavioral: Positive for sleep disturbance. Negative for agitation and dysphoric mood. The patient is not nervous/anxious.     Objective:  BP 124/72 (BP Location: Left Arm, Patient Position: Sitting, Cuff Size: Large)   Pulse (!) 55   Temp 98.5 F (36.9 C) (Oral)   Ht 5\' 6"  (1.676 m)   Wt 239 lb (108.4 kg)   SpO2 97%   BMI 38.58 kg/m   BP Readings from Last 3 Encounters:  06/29/17 124/72  12/05/16 126/72  06/06/16 136/74    Wt Readings from Last 3 Encounters:  06/29/17 239 lb (108.4 kg)  12/05/16 236 lb (107 kg)  06/06/16 238 lb (108 kg)    Physical Exam  Constitutional: He is oriented to person, place, and time. He appears well-developed. No distress.  NAD  HENT:  Mouth/Throat: Oropharynx is clear and moist.  Eyes: Conjunctivae are normal. Pupils are equal, round, and reactive to light.  Neck: Normal range of motion. No JVD present. No thyromegaly present.  Cardiovascular: Normal rate, regular rhythm, normal heart sounds and intact distal pulses. Exam reveals no gallop and no friction rub.  No murmur heard. Pulmonary/Chest: Effort normal and breath sounds normal. No respiratory distress.  He has no wheezes. He has no rales. He exhibits no tenderness.  Abdominal: Soft. Bowel sounds are normal. He exhibits no distension and no mass. There is no tenderness. There is no rebound and no guarding.  Musculoskeletal: Normal range of motion. He exhibits no edema or tenderness.  Lymphadenopathy:    He has no cervical adenopathy.  Neurological: He is alert and oriented to person, place, and time. He has normal reflexes. No cranial nerve deficit. He exhibits normal muscle  tone. He displays a negative Romberg sign. Coordination and gait normal.  Skin: Skin is warm and dry. No rash noted.  Psychiatric: He has a normal mood and affect. His behavior is normal. Judgment and thought content normal.   SKs - scalp, Obese Midline abd hernia  Lab Results  Component Value Date   WBC 7.6 12/05/2016   HGB 16.2 12/05/2016   HCT 46.7 12/05/2016   PLT 250.0 12/05/2016   GLUCOSE 123 (H) 12/05/2016   CHOL 184 12/05/2016   TRIG 256.0 (H) 12/05/2016   HDL 25.80 (L) 12/05/2016   LDLDIRECT 121.0 12/05/2016   LDLCALC 140 (H) 06/08/2015   ALT 67 (H) 12/05/2016   AST 39 (H) 12/05/2016   NA 139 12/05/2016   K 4.2 12/05/2016   CL 106 12/05/2016   CREATININE 0.95 12/05/2016   BUN 13 12/05/2016   CO2 27 12/05/2016   TSH 1.16 12/05/2016   PSA 0.31 12/05/2016   HGBA1C 6.0 12/07/2015    Mr Shoulder Left Wo Contrast  Result Date: 02/13/2016 CLINICAL DATA:  62 year old with left shoulder pain for 2 months. Shoulder surgery 7 years ago. No recent injury. EXAM: MRI OF THE LEFT SHOULDER WITHOUT CONTRAST TECHNIQUE: Multiplanar, multisequence MR imaging of the shoulder was performed. No intravenous contrast was administered. COMPARISON:  Left shoulder MR arthrogram 11/18/2008 FINDINGS: Postsurgical changes in the humeral head status post rotator cuff repair are grossly stable. No definite interval surgical changes are identified. Rotator cuff: The anchor screws within the greater tuberosity are unchanged in position. There is progressive supraspinatus and infraspinatus tendinosis. There is a small full-thickness, non retracted insertional tear of the supraspinatus tendon anteriorly, best seen on sagittal image 18. The subscapularis tendon is chronically attenuated without acute abnormality. The teres minor tendon appears normal. Muscles: Chronic subscapularis muscular atrophy. The additional components of the rotator cuff appear unremarkable. Biceps long head: Chronically ruptured versus  previous tenotomy. Unchanged from previous study. Acromioclavicular Joint: The acromion is type 2. There are stable acromioclavicular degenerative changes with fluid in the acromioclavicular joint. A small amount fluid is present in the subacromial -subdeltoid bursa. Glenohumeral Joint: No significant joint effusion. Stable mild glenohumeral degenerative changes. Labrum: Stable superior labral blunting, likely from previous debridement. No labral tear or paralabral cyst identified. Bones: No acute or significant extra-articular osseous findings. Other: No significant soft tissue findings. IMPRESSION: 1. Stable postsurgical changes from previous rotator cuff repair. 2. There is progressive rotator cuff tendinosis with a small full thickness insertional tear of the supraspinatus tendon anteriorly. The subscapularis tendon is chronically attenuated with associated muscular atrophy. 3. 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 4. Stable acromioclavicular degenerative changes and joint effusion. Electronically Signed   By: Richardean Sale M.D.   On: 02/13/2016 13:04    Assessment & Plan:   There are no diagnoses linked to this encounter. I am having Jerry Cohen maintain his aspirin, Cholecalciferol, metFORMIN, valsartan-hydrochlorothiazide, amLODipine, clotrimazole-betamethasone, meloxicam, esomeprazole, Icosapent Ethyl, triamcinolone cream, and rOPINIRole.  No orders of the defined types were placed in this encounter.    Follow-up: No Follow-up on file.  Walker Kehr, MD

## 2017-06-29 NOTE — Assessment & Plan Note (Addendum)
snoring/OSA - would like to investigate surgical options - ENT ref Neurol ref - Dr Brett Fairy

## 2017-07-06 ENCOUNTER — Other Ambulatory Visit (INDEPENDENT_AMBULATORY_CARE_PROVIDER_SITE_OTHER): Payer: Commercial Managed Care - PPO

## 2017-07-06 DIAGNOSIS — E785 Hyperlipidemia, unspecified: Secondary | ICD-10-CM | POA: Diagnosis not present

## 2017-07-06 DIAGNOSIS — I10 Essential (primary) hypertension: Secondary | ICD-10-CM

## 2017-07-06 DIAGNOSIS — R7989 Other specified abnormal findings of blood chemistry: Secondary | ICD-10-CM

## 2017-07-06 DIAGNOSIS — R945 Abnormal results of liver function studies: Secondary | ICD-10-CM | POA: Diagnosis not present

## 2017-07-06 LAB — LIPID PANEL
CHOLESTEROL: 226 mg/dL — AB (ref 0–200)
HDL: 27.4 mg/dL — ABNORMAL LOW (ref >39.00)
LDL CALC: 158 mg/dL — AB (ref 0–99)
NonHDL: 198.2
Total CHOL/HDL Ratio: 8
Triglycerides: 199 mg/dL — ABNORMAL HIGH (ref 0.0–149.0)
VLDL: 39.8 mg/dL (ref 0.0–40.0)

## 2017-07-06 LAB — HEMOGLOBIN A1C: Hgb A1c MFr Bld: 7.7 % — ABNORMAL HIGH (ref 4.6–6.5)

## 2017-07-06 LAB — BASIC METABOLIC PANEL
BUN: 12 mg/dL (ref 6–23)
CHLORIDE: 100 meq/L (ref 96–112)
CO2: 29 mEq/L (ref 19–32)
Calcium: 9.5 mg/dL (ref 8.4–10.5)
Creatinine, Ser: 1.05 mg/dL (ref 0.40–1.50)
GFR: 75.67 mL/min (ref >60.00)
GLUCOSE: 203 mg/dL — AB (ref 70–99)
POTASSIUM: 3.8 meq/L (ref 3.5–5.1)
Sodium: 137 mEq/L (ref 135–145)

## 2017-07-06 LAB — HEPATIC FUNCTION PANEL
ALBUMIN: 4.3 g/dL (ref 3.5–5.2)
ALK PHOS: 64 U/L (ref 39–117)
ALT: 102 U/L — ABNORMAL HIGH (ref 0–53)
AST: 50 U/L — ABNORMAL HIGH (ref 0–37)
BILIRUBIN DIRECT: 0.2 mg/dL (ref 0.0–0.3)
Total Bilirubin: 1.6 mg/dL — ABNORMAL HIGH (ref 0.2–1.2)
Total Protein: 7.4 g/dL (ref 6.0–8.3)

## 2017-07-10 ENCOUNTER — Other Ambulatory Visit: Payer: Self-pay | Admitting: Internal Medicine

## 2017-07-10 MED ORDER — METFORMIN HCL 500 MG PO TABS: 500.0000 mg | ORAL_TABLET | Freq: Two times a day (BID) | ORAL | 3 refills | Status: DC

## 2017-07-17 DIAGNOSIS — G4730 Sleep apnea, unspecified: Secondary | ICD-10-CM | POA: Diagnosis not present

## 2017-07-17 DIAGNOSIS — Z9989 Dependence on other enabling machines and devices: Secondary | ICD-10-CM | POA: Diagnosis not present

## 2017-07-31 ENCOUNTER — Ambulatory Visit: Payer: Commercial Managed Care - PPO | Admitting: Neurology

## 2017-07-31 ENCOUNTER — Encounter: Payer: Self-pay | Admitting: Neurology

## 2017-07-31 VITALS — BP 128/78 | HR 76 | Ht 67.0 in | Wt 241.0 lb

## 2017-07-31 DIAGNOSIS — G478 Other sleep disorders: Secondary | ICD-10-CM

## 2017-07-31 DIAGNOSIS — Z9989 Dependence on other enabling machines and devices: Secondary | ICD-10-CM | POA: Diagnosis not present

## 2017-07-31 DIAGNOSIS — G4733 Obstructive sleep apnea (adult) (pediatric): Secondary | ICD-10-CM | POA: Diagnosis not present

## 2017-07-31 DIAGNOSIS — Z789 Other specified health status: Secondary | ICD-10-CM

## 2017-07-31 DIAGNOSIS — G2581 Restless legs syndrome: Secondary | ICD-10-CM | POA: Diagnosis not present

## 2017-07-31 NOTE — Progress Notes (Signed)
Subjective:    Patient ID: Jerry Cohen is a 62 y.o. male.  HPI     Jerry Age, MD, PhD Jerry Cohen [address].O. [place], [place] [address]  Dear Dr. Alain Cohen,   I saw your patient, Jerry Cohen, upon your kind request, in my neurologic clinic today for initial consultation of his sleep disorder, in particular, relation of his prior diagnosis of obstructive sleep apnea. The patient is accompanied by his wife today. As you know, Jerry Cohen is a 62 year old right-handed gentleman with an underlying medical history of diabetes, hyperlipidemia, hypertension, vitamin D deficiency, elevated liver enzymes, restless leg syndrome and obesity, who was previously diagnosed with obstructive sleep apnea and placed on CPAP therapy. Prior sleep study reports are not available for my review today. He brought his CPAP machine but a download is not possible from his older CPAP machine. He reports being compliant with CPAP. His DME company is [facility]. I reviewed your office note from 06/29/2017. His Epworth sleepiness score is 2 out of 24, fatigue score is 21 out of 63. He is married and lives with his wife. They have 3 grown children. He goes to bed around 7 PM and rise time is around 3 AM. He does not know his pressure setting, uses a FFM, with air leaking around the face and into eyes at times, which is bothersome. He has gained weight. He had sleep study almost 15 years ago, this is his second CPAP machine, likely 62 years old. He has a Building surveyor. He tosses and turns, c/o difficulty keeping the mask in place and not waking up rested, RLS under reasonable control with Requip.  2 of his sisters have sleep apnea. He recently saw ENT, Dr. Constance Cohen, on 07/17/2017 and was found to have mild deviation of septum, surgical approach to sleep apnea treatment was not recommended. Patient is restless, but he would be willing to consider continuing with CPAP therapy  but would like to be optimized and his treatment setting for possible and consider a different type of mask as this is one of his issues. He does not always drink enough water he admits. He likes to drink regular Coca-Cola, about 2 of the 16 ounce bottles per day, drinks about 3 cups of coffee per day.  His Past Medical History Is Significant For: Past Medical History:  Diagnosis Date  . Dizziness 2012  . ED (erectile dysfunction)   . GERD (gastroesophageal reflux disease)   . Hx of colonic polyps    Dr Fuller Plan  . Hyperlipidemia   . Hypertension   . OSA on CPAP   . Osteoarthritis   . Sleep apnea     His Past Surgical History Is Significant For: Past Surgical History:  Procedure Laterality Date  . APPENDECTOMY  1996  . COLONOSCOPY  2015  . KNEE ARTHROSCOPY Left 2009  . LASIK Bilateral 2007  . SHOULDER ARTHROSCOPY W/ ROTATOR CUFF REPAIR Left 2010    His Family History Is Significant For: Family History  Problem Relation Cohen of Onset  . Cancer Father        kidney and prostate   . Colon cancer Father 51  . Coronary artery disease Neg Hx   . Cancer Other        colon- <60  . Colon cancer Other 30  . Mental illness Mother        dementia  . Cancer Sister  colon cancer  . Colon cancer Sister 106  . Colon cancer Sister 63    His Social History Is Significant For: Social History   Socioeconomic History  . Marital status: Married    Spouse name: None  . Number of children: None  . Years of education: None  . Highest education level: None  Social Needs  . Financial resource strain: None  . Food insecurity - worry: None  . Food insecurity - inability: None  . Transportation needs - medical: None  . Transportation needs - non-medical: None  Occupational History  . None  Tobacco Use  . Smoking status: Current Some Day Smoker    Types: Cigars  . Smokeless tobacco: Never Used  . Tobacco comment: quit smoking 30 years ago.  Substance and Sexual Activity  .  Alcohol use: Yes    Alcohol/week: 0.0 oz    Comment: about 2 times per month  . Drug use: No  . Sexual activity: Yes  Other Topics Concern  . None  Social History Narrative  . None    His Allergies Are:  Allergies  Allergen Reactions  . Atorvastatin Other (See Comments)    REACTION: aches in muscles  :   His Current Medications Are:  Outpatient Encounter Medications as of 07/31/2017  Medication Sig  . amLODipine (NORVASC) 5 MG tablet Take 1 tablet (5 mg total) by mouth daily.  Marland Kitchen aspirin 81 MG EC tablet Take 81 mg by mouth daily.    . Cholecalciferol (EQL VITAMIN D3) 1000 UNITS tablet Take 1,000 Units by mouth daily.    . clotrimazole-betamethasone (LOTRISONE) cream Apply topically 2 (two) times daily.  Marland Kitchen esomeprazole (NEXIUM) 40 MG capsule TAKE 1 CAPSULE (40 MG TOTAL) BY MOUTH DAILY.INS ALLOWS 30 DAYS  . Icosapent Ethyl (VASCEPA) 1 g CAPS Take 2 capsules by mouth 2 (two) times daily.  . meloxicam (MOBIC) 15 MG tablet TAKE 1 TABLET (15 MG TOTAL) BY MOUTH DAILY AS NEEDED FOR PAIN (PC).  . metFORMIN (GLUCOPHAGE) 500 MG tablet Take 1 tablet (500 mg total) by mouth 2 (two) times daily with a meal.  . rOPINIRole (REQUIP) 1 MG tablet TAKE 1 TABLET BY MOUTH AT BEDTIME AS NEEDED FOR RESTLESS LEGS  . triamcinolone cream (KENALOG) 0.5 % APPLY TOPICALLY 3 (THREE) TIMES DAILY.  . valsartan-hydrochlorothiazide (DIOVAN-HCT) 320-25 MG tablet Take 1 tablet by mouth daily.   No facility-administered encounter medications on file as of 07/31/2017.   :  Review of Systems:  Out of a complete 14 point review of systems, all are reviewed and negative with the exception of these symptoms as listed below: Review of Systems  Neurological:       Pt presents today to discuss his sleep. Pt needs new cpap supplies. Pt is using [facility]. Pt uses his cpap nightly.  Epworth Sleepiness Scale 0= would never doze 1= slight chance of dozing 2= moderate chance of dozing 3= high chance of dozing  Sitting and  reading: 1 Watching TV: 1 Sitting inactive in a public place (ex. Theater or meeting):0 As a passenger in a car for an hour without a break: 0 Lying down to rest in the afternoon: 0 Sitting and talking to someone: 0 Sitting quietly after lunch (no alcohol): 0 In a car, while stopped in traffic: 0 Total: 2     Objective:  Neurological Exam  Physical Exam Physical Examination:   Vitals:   07/31/17 1558  BP: 128/78  Pulse: 76   General Examination: The patient is  a very pleasant 62 y.o. male in no acute distress. He appears well-developed and well-nourished and well groomed.   HEENT: Normocephalic, atraumatic, pupils are equal, round and reactive to light and accommodation. Funduscopic exam is normal with sharp disc margins noted. Extraocular tracking is good without limitation to gaze excursion or nystagmus noted. Normal smooth pursuit is noted. Hearing is grossly intact. Tympanic membranes are clear bilaterally. Face is symmetric with normal facial animation and normal facial sensation. Speech is clear with no dysarthria noted. There is no hypophonia. There is no lip, neck/head, jaw or voice tremor. Neck is supple with full range of passive and active motion. There are no carotid bruits on auscultation. Oropharynx exam reveals: mild mouth dryness, good dental hygiene and moderate airway crowding secondary to smaller airway entry, redundant soft palate, large uvula and tonsils in place, tonsils are not fully visualized. Mallampati is class II. Tongue protrudes centrally and palate elevates symmetrically. Neck circumference is 19-1/8 inches.  Chest: Clear to auscultation without wheezing, rhonchi or crackles noted.  Heart: S1+S2+0, regular and normal without murmurs, rubs or gallops noted.   Abdomen: Soft, non-tender and non-distended with normal bowel sounds appreciated on auscultation.  Extremities: There is no pitting edema in the distal lower extremities bilaterally. Pedal pulses  are intact.  Skin: Warm and dry without trophic changes noted.  Musculoskeletal: exam reveals no obvious joint deformities, tenderness or joint swelling or erythema.   Neurologically:  Mental status: The patient is awake, alert and oriented in all 4 spheres. His immediate and remote memory, attention, language skills and fund of knowledge are appropriate. There is no evidence of aphasia, agnosia, apraxia or anomia. Speech is clear with normal prosody and enunciation. Thought process is linear. Mood is normal and affect is normal.  Cranial nerves II - XII are as described above under HEENT exam. In addition: shoulder shrug is normal with equal shoulder height noted. Motor exam: Normal bulk, strength and tone is noted. There is no drift, tremor or rebound. Fine motor skills and coordination: grossly intact.  Cerebellar testing: No dysmetria or intention tremor. There is no truncal or gait ataxia.  Sensory exam: intact to light touch in the upper and lower extremities.  Gait, station and balance: He stands easily. No veering to one side is noted. No leaning to one side is noted. Posture is Cohen-appropriate and stance is narrow based. Gait shows normal stride length and pace.                Assessment and Plan:   In summary, Jerry Cohen is a very pleasant 62 y.o.-year old male with an underlying medical history of diabetes, hyperlipidemia, hypertension, vitamin D deficiency, elevated liver enzymes, restless leg syndrome and obesity, who diagnosed with obstructive sleep apnea about 15 years ago it has been on CPAP therapy. He indicates compliance with treatment, unfortunately, a compliance download was not possible from his machine today as this is an older machine. Nevertheless, he complains about difficulty keeping the mask on at night, restless sleep, nonrestorative sleep. He looked into surgical options for sleep apnea treatment but was not considered a candidate for sleep apnea surgery her ENT. He  also has a history of restless leg syndrome which currently is under reasonably good control on Requip 1 mg at night. He is reminded to try to take this medication about 90 minutes to 120 minutes before his bedtime. I would like to bring him back in for a sleep study to reevaluate his sleep apnea  and to help optimize his treatment settings and mask fit. He may benefit from a different type of nasal or oronasal interface.  I had a long chat with the patient and his wife about my findings and the diagnosis of OSA, its prognosis and treatment options. We talked about medical treatments, surgical interventions and non-pharmacological approaches. I explained in particular the risks and ramifications of untreated moderate to severe OSA, especially with respect to developing cardiovascular disease down the Road, including congestive heart failure, difficult to treat hypertension, cardiac arrhythmias, or stroke. Even type 2 diabetes has, in part, been linked to untreated OSA. Symptoms of untreated OSA include daytime sleepiness, memory problems, mood irritability and mood disorder such as depression and anxiety, lack of energy, as well as recurrent headaches, especially morning headaches. We talked about cigar smoking cessation and trying to maintain a healthy lifestyle in general, as well as the importance of weight control. I encouraged the patient to eat healthy, exercise daily and keep well hydrated, to keep a scheduled bedtime and wake time routine, to not skip any meals and eat healthy snacks in between meals. I advised the patient not to drive when feeling sleepy.  Is encouraged to increase his water intake and reduce his soda intake. I recommended the following at this time: sleep study with potential positive airway pressure titration. (We will score hypopneas at 4%.   I explained the sleep test procedure to the patient and also outlined possible surgical and non-surgical treatment options of OSA, including  the use of a custom-made dental device (which would require a referral to a specialist dentist or oral surgeon), upper airway surgical options, such as pillar implants, radiofrequency surgery, tongue base surgery, and UPPP (which would involve a referral to an ENT surgeon). Rarely, jaw surgery such as mandibular advancement may be considered.  I also explained the CPAP treatment option to the patient, who indicated that he would be willing to keep using PAP therapy for OSA, if the need arises. I explained the importance of being compliant with PAP treatment, not only for insurance purposes but primarily to improve His symptoms, and for the patient's long term health benefit, including to reduce His cardiovascular risks. I answered all their questions today and the patient and his wife were in agreement. I would like to see him back after the sleep study is completed and encouraged him to call with any interim questions, concerns, problems or updates.   Thank you very much for allowing me to participate in the care of this nice patient. If I can be of any further assistance to you please do not hesitate to call me at [phone].  Sincerely,   Jerry Age, MD, PhD

## 2017-07-31 NOTE — Patient Instructions (Addendum)

## 2017-08-30 ENCOUNTER — Other Ambulatory Visit: Payer: Self-pay | Admitting: Internal Medicine

## 2017-09-14 ENCOUNTER — Ambulatory Visit
Admission: RE | Admit: 2017-09-14 | Discharge: 2017-09-14 | Disposition: A | Discharge status: Home / Self Care | Payer: Commercial Managed Care - PPO | Source: Ambulatory Visit | Attending: Internal Medicine | Admitting: Internal Medicine

## 2017-09-14 DIAGNOSIS — R7989 Other specified abnormal findings of blood chemistry: Secondary | ICD-10-CM

## 2017-09-14 DIAGNOSIS — R945 Abnormal results of liver function studies: Principal | ICD-10-CM

## 2017-09-21 ENCOUNTER — Ambulatory Visit (INDEPENDENT_AMBULATORY_CARE_PROVIDER_SITE_OTHER): Payer: Commercial Managed Care - PPO | Admitting: Neurology

## 2017-09-21 DIAGNOSIS — G2581 Restless legs syndrome: Secondary | ICD-10-CM

## 2017-09-21 DIAGNOSIS — G478 Other sleep disorders: Secondary | ICD-10-CM

## 2017-09-21 DIAGNOSIS — Z9989 Dependence on other enabling machines and devices: Secondary | ICD-10-CM

## 2017-09-21 DIAGNOSIS — G4733 Obstructive sleep apnea (adult) (pediatric): Secondary | ICD-10-CM

## 2017-09-21 DIAGNOSIS — Z789 Other specified health status: Secondary | ICD-10-CM

## 2017-09-21 DIAGNOSIS — G4720 Circadian rhythm sleep disorder, unspecified type: Secondary | ICD-10-CM

## 2017-09-25 ENCOUNTER — Telehealth: Payer: Self-pay

## 2017-09-25 NOTE — Progress Notes (Signed)
Patient referred by Dr. Alain Marion, seen by me on 07/31/17, split night sleep study on 09/21/17. Please call and notify patient that the recent sleep study confirmed the diagnosis of severe OSA. He did well with CPAP during the study with improvement of the respiratory events. Therefore, I would like to Rx a new CPAP machine for him. I placed the order in the chart. I believe, his DME is [facility].  Please advise patient that we need a follow up appointment with either myself or one of our nurse practitioners in about 10 weeks post set-up to check for how the patient is feeling and how well the patient is using the machine, etc. Please go ahead and schedule the appointment, while you have the patient on the phone and make sure patient understands the importance of keeping this window for the FU appointment, as it is often an insurance requirement. Failing to adhere to this may result in losing coverage for sleep apnea treatment, at which point most patients are left with a choice of returning the machine or paying out of pocket (and we want neither of this to happen!).  Please re-enforce the importance of compliance with treatment and the need for [place] to monitor compliance data - again an insurance requirement and usually a good feedback for the patient as far as how they are doing.  Also remind patient, that any PAP machine or mask issues should be first addressed with the DME company, who provided the machine/mask.  Please ask if patient has a preference regarding DME company, may depend on the insurance too.  Please arrange for CPAP set up at home through a DME company of patient's choice.  Once you have spoken to the patient you can close the phone encounter. Please fax/route report to referring provider, thanks,   [name], MD, PhD [facility])

## 2017-09-25 NOTE — Telephone Encounter (Signed)
-----   Message from [name], MD sent at 09/25/2017  7:58 AM EDT ----- Patient referred by Dr. Alain Marion, seen by me on 07/31/17, split night sleep study on 09/21/17. Please call and notify patient that the recent sleep study confirmed the diagnosis of severe OSA. He did well with CPAP during the study with improvement of the respiratory events. Therefore, I would like to Rx a new CPAP machine for him. I placed the order in the chart. I believe, his DME is [facility].  Please advise patient that we need a follow up appointment with either myself or one of our nurse practitioners in about 10 weeks post set-up to check for how the patient is feeling and how well the patient is using the machine, etc. Please go ahead and schedule the appointment, while you have the patient on the phone and make sure patient understands the importance of keeping this window for the FU appointment, as it is often an insurance requirement. Failing to adhere to this may result in losing coverage for sleep apnea treatment, at which point most patients are left with a choice of returning the machine or paying out of pocket (and we want neither of this to happen!).  Please re-enforce the importance of compliance with treatment and the need for [place] to monitor compliance data - again an insurance requirement and usually a good feedback for the patient as far as how they are doing.  Also remind patient, that any PAP machine or mask issues should be first addressed with the DME company, who provided the machine/mask.  Please ask if patient has a preference regarding DME company, may depend on the insurance too.  Please arrange for CPAP set up at home through a DME company of patient's choice.  Once you have spoken to the patient you can close the phone encounter. Please fax/route report to referring provider, thanks,   [name], MD, PhD [facility])

## 2017-09-25 NOTE — Procedures (Signed)
PATIENT'S NAME:  Jerry Cohen, Jerry Cohen DOB:      13-Jan-1956      MR#:    [id number]     DATE OF RECORDING: 09/21/2017 REFERRING M.D.:  Lew Dawes, MD Study Performed:  Split-Night Titration Study HISTORY: 62 year old man with a history of diabetes, hyperlipidemia, hypertension, vitamin D deficiency, elevated liver enzymes, restless leg syndrome and obesity, who was previously diagnosed with obstructive sleep apnea and placed on CPAP therapy. He requires re-evaluation. His Epworth sleepiness score is 2 out of 24. The patient's weight 241 pounds with a height of 67 (inches), resulting in a BMI of 37.7 kg/m2. The patient's neck circumference measured 19 inches.  CURRENT MEDICATIONS: orvasc, Aspirin, EQL Vitamin D3, Lotrisone, Nexium, Vascepa, Mobic, Glucophage, Requip, Kenalog, Diovan-HCT  PROCEDURE:  This is a multichannel digital polysomnogram utilizing the Somnostar 11.2 system.  Electrodes and sensors were applied and monitored per AASM Specifications.   EEG, EOG, Chin and Limb EMG, were sampled at 200 Hz.  ECG, Snore and Nasal Pressure, Thermal Airflow, Respiratory Effort, CPAP Flow and Pressure, Oximetry was sampled at 50 Hz. Digital video and audio were recorded.      BASELINE STUDY WITHOUT CPAP RESULTS:  Lights Out was at 20:39 and Lights On at 05:12 for the night, split study started at 00:18, epoch 446. Total recording time (TRT) was 222.5, with a total sleep time (TST) of 123 minutes.   The patient's sleep latency was normal. REM sleep was absent. The sleep efficiency was 55.3 %.    SLEEP ARCHITECTURE: WASO (Wake after sleep onset) was 90 minutes with severe sleep fragmentation noted, Stage N1 was 50 minutes, Stage N2 was 73 minutes, Stage N3 was 0 minutes and Stage R (REM sleep) was 0 minutes.  The percentages were Stage N1 40.7%, which is highly increased, Stage N2 59.3%, which is increased, Stage N3 and Stage R (REM sleep) were absent. The arousals were noted as: 87 were spontaneous, 0 were  associated with PLMs, 108 were associated with respiratory events.  Audio and video analysis did not show any abnormal or unusual movements, behaviors, phonations or vocalizations. The patient took no bathroom breaks. Mild to moderate snoring was noted. The EKG was in keeping with normal sinus rhythm (NSR).   RESPIRATORY ANALYSIS:  There were a total of 132 respiratory events:  68 obstructive apneas, 0 central apneas and 1 mixed apneas with a total of 69 apneas and an apnea index (AI) of 33.7. There were 63 hypopneas with a hypopnea index of 30.7. The patient also had 0 respiratory event related arousals (RERAs).  Snoring was noted.     The total APNEA/HYPOPNEA INDEX (AHI) was 64.4 /hour and the total RESPIRATORY DISTURBANCE INDEX was 64.4 /hour.  0 events occurred in REM sleep and 127 events in NREM. The REM AHI was 0, /hour versus a non-REM AHI of 64.4 /hour. The patient spent 140 minutes sleep time in the supine position 223 minutes in non-supine. The supine AHI was 63.6 /hour versus a non-supine AHI of 75.0 /hour.  OXYGEN SATURATION & C02:  The wake baseline 02 saturation was 93%, with the lowest being 87%. Time spent below 89% saturation equaled 2 minutes.  PERIODIC LIMB MOVEMENTS: The patient had a total of 0 Periodic Limb Movements.  The Periodic Limb Movement (PLM) index was 0 /hour and the PLM Arousal index was 0 /hour.  TITRATION STUDY WITH CPAP RESULTS:   The patient was fitted with a small Simplus FFM. CPAP was initiated at 5 cmH20 with  heated humidity per AASM split night standards and pressure was advanced to 10 cmH20 because of hypopneas, apneas and desaturations.  At a PAP pressure of 10 cmH20, there was a reduction of the AHI to 9.2/hour, with supine NREM sleep achieved, O2 nadir of 91%.   Total recording time (TRT) was 291 minutes, with a total sleep time (TST) of 240 minutes. The patient's sleep latency was 12 minutes. REM latency was 48 minutes.  The sleep efficiency was 82.5 %.     SLEEP ARCHITECTURE: Wake after sleep was 44.5 minutes, Stage N1 15.5 minutes, Stage N2 129.5 minutes, Stage N3 54.5 minutes and Stage R (REM sleep) 40.5 minutes. The percentages were: Stage N1 6.5%, Stage N2 54.%, Stage N3 22.7% and Stage R (REM sleep) 16.9%. The arousals were noted as: 16 were spontaneous, 0 were associated with PLMs, 8 were associated with respiratory events.  RESPIRATORY ANALYSIS:  There were a total of 25 respiratory events: 8 obstructive apneas, 0 central apneas and 0 mixed apneas with a total of 8 apneas and an apnea index (AI) of 2. There were 17 hypopneas with a hypopnea index of 4.25 /hour. The patient also had 0 respiratory event related arousals (RERAs).      The total APNEA/HYPOPNEA INDEX  (AHI) was 6.25 /hour and the total RESPIRATORY DISTURBANCE INDEX was 6.25 /hour.  11 events occurred in REM sleep and 14 events in NREM. The REM AHI was 16.3 /hour versus a non-REM AHI of 4.2 /hour. REM sleep was achieved on a pressure of  cm/h2o (AHI was  .) The patient spent 10% of total sleep time in the supine position. The supine AHI was 19.2 /hour, versus a non-supine AHI of 4.8/hour.  OXYGEN SATURATION & C02:  The wake baseline 02 saturation was 94%, with the lowest being 74%. Time spent below 89% saturation equaled 18 minutes.  PERIODIC LIMB MOVEMENTS: The patient had a total of 0 Periodic Limb Movements. The Periodic Limb Movement (PLM) index was 0 /hour and the PLM Arousal index was 0 /hour.  Post-study, the patient indicated that sleep was the same as usual.  POLYSOMNOGRAPHY IMPRESSION :   1. Obstructive Sleep Apnea (OSA)  2. Dysfunctions associated with sleep stages or arousals from sleep  RECOMMENDATIONS:  1. This patient has severe obstructive sleep apnea and responded well on CPAP therapy. The absence of REM sleep during the baseline portion of the study likely underestimates his AHI and O2 nadir. Due to residual sleep disordered breathing on the final titration  pressure of 10 cm, I will recommend a home CPAP treatment pressure of 11 cm via small FFM, with heated humidity. The patient should be reminded to be fully compliant with PAP therapy to improve sleep related symptoms and decrease long term cardiovascular risks. Please note that untreated obstructive sleep apnea carries additional perioperative morbidity. Patients with significant obstructive sleep apnea should receive perioperative PAP therapy and the surgeons and particularly the anesthesiologist should be informed of the diagnosis and the severity of the sleep disordered breathing. 2. This study shows sleep fragmentation and abnormal sleep stage percentages; these are nonspecific findings and per se do not signify an intrinsic sleep disorder or a cause for the patient's sleep-related symptoms. Causes include (but are not limited to) the first night effect of the sleep study, circadian rhythm disturbances, medication effect or an underlying mood disorder or medical problem.  3. The patient should be cautioned not to drive, work at heights, or operate dangerous or heavy equipment when tired  or sleepy. Review and reiteration of good sleep hygiene measures should be pursued with any patient. 4. The patient will be seen in follow-up by Dr. Rexene Alberts at [facility] for discussion of the test results and further management strategies. The referring provider will be notified of the test results.  I certify that I have reviewed the entire raw data recording prior to the issuance of this report in accordance with the Standards of Accreditation of the American Academy of Sleep Medicine (AASM)   [name], MD, PhD Diplomat, American Board of Psychiatry and Neurology (Neurology and Sleep Medicine)

## 2017-09-25 NOTE — Addendum Note (Signed)
Addended by: [name] on: 09/25/2017 07:58 AM   Modules accepted: Orders

## 2017-09-25 NOTE — Telephone Encounter (Signed)
I called pt. I advised pt that Dr. Rexene Alberts reviewed their sleep study results and found that pt has severe osa but did well with the cpap during his latest sleep study. Dr. Rexene Alberts recommends that pt start a new cpap at home. I reviewed PAP compliance expectations with the pt. Pt is agreeable to starting a CPAP. I advised pt that an order will be sent to a DME, [facility], and [facility] will call the pt within about one week after they file with the pt's insurance. [facility] will show the pt how to use the machine, fit for masks, and troubleshoot the CPAP if needed. A follow up appt was made for insurance purposes with Dr. Rexene Alberts on 12/25/17 at 1:00pm. Pt verbalized understanding to arrive 15 minutes early and bring their CPAP. A letter with all of this information in it will be mailed to the pt as a reminder. I verified with the pt that the address we have on file is correct. Pt verbalized understanding of results. Pt had no questions at this time but was encouraged to call back if questions arise.

## 2017-09-28 ENCOUNTER — Ambulatory Visit: Payer: Commercial Managed Care - PPO | Admitting: Internal Medicine

## 2017-10-01 ENCOUNTER — Ambulatory Visit: Payer: Commercial Managed Care - PPO | Admitting: Internal Medicine

## 2017-10-19 ENCOUNTER — Ambulatory Visit: Payer: Commercial Managed Care - PPO | Admitting: Internal Medicine

## 2017-10-26 ENCOUNTER — Other Ambulatory Visit (INDEPENDENT_AMBULATORY_CARE_PROVIDER_SITE_OTHER): Payer: Commercial Managed Care - PPO

## 2017-10-26 ENCOUNTER — Other Ambulatory Visit: Payer: Self-pay | Admitting: Internal Medicine

## 2017-10-26 ENCOUNTER — Telehealth: Payer: Self-pay | Admitting: Neurology

## 2017-10-26 ENCOUNTER — Encounter: Payer: Self-pay | Admitting: Internal Medicine

## 2017-10-26 ENCOUNTER — Ambulatory Visit (INDEPENDENT_AMBULATORY_CARE_PROVIDER_SITE_OTHER): Payer: Commercial Managed Care - PPO | Admitting: Internal Medicine

## 2017-10-26 VITALS — BP 132/84 | HR 54 | Temp 98.4°F | Ht 67.0 in | Wt 236.0 lb

## 2017-10-26 DIAGNOSIS — K760 Fatty (change of) liver, not elsewhere classified: Secondary | ICD-10-CM | POA: Diagnosis not present

## 2017-10-26 DIAGNOSIS — I10 Essential (primary) hypertension: Secondary | ICD-10-CM | POA: Diagnosis not present

## 2017-10-26 DIAGNOSIS — R739 Hyperglycemia, unspecified: Secondary | ICD-10-CM

## 2017-10-26 DIAGNOSIS — R7989 Other specified abnormal findings of blood chemistry: Secondary | ICD-10-CM

## 2017-10-26 DIAGNOSIS — R945 Abnormal results of liver function studies: Secondary | ICD-10-CM

## 2017-10-26 DIAGNOSIS — R635 Abnormal weight gain: Secondary | ICD-10-CM

## 2017-10-26 LAB — HEPATIC FUNCTION PANEL
ALT: 84 U/L — AB (ref 0–53)
AST: 49 U/L — ABNORMAL HIGH (ref 0–37)
Albumin: 4.2 g/dL (ref 3.5–5.2)
Alkaline Phosphatase: 62 U/L (ref 39–117)
BILIRUBIN DIRECT: 0.2 mg/dL (ref 0.0–0.3)
BILIRUBIN TOTAL: 1.3 mg/dL — AB (ref 0.2–1.2)
Total Protein: 7.3 g/dL (ref 6.0–8.3)

## 2017-10-26 LAB — BASIC METABOLIC PANEL WITH GFR
BUN: 11 mg/dL (ref 6–23)
CO2: 26 meq/L (ref 19–32)
Calcium: 9.3 mg/dL (ref 8.4–10.5)
Chloride: 103 meq/L (ref 96–112)
Creatinine, Ser: 0.85 mg/dL (ref 0.40–1.50)
GFR: 96.48 mL/min
Glucose, Bld: 149 mg/dL — ABNORMAL HIGH (ref 70–99)
Potassium: 3.7 meq/L (ref 3.5–5.1)
Sodium: 137 meq/L (ref 135–145)

## 2017-10-26 LAB — HEMOGLOBIN A1C: Hgb A1c MFr Bld: 8 % — ABNORMAL HIGH (ref 4.6–6.5)

## 2017-10-26 MED ORDER — METFORMIN HCL ER 750 MG PO TB24: 1500.0000 mg | ORAL_TABLET | Freq: Every day | ORAL | 11 refills | Status: DC

## 2017-10-26 NOTE — Telephone Encounter (Signed)
Pt wife(on DPR) has called (In reference to phone note from 09-25-2017) pt has not been contacted by [facility] yet re: pt starting new CPAP, please call wife.

## 2017-10-26 NOTE — Assessment & Plan Note (Signed)
On Norvasc and Diovan HCT

## 2017-10-26 NOTE — Telephone Encounter (Signed)
I resent order to [facility], not sure if they received the first one.   I called pt's wife, Magda Paganini, per DPR and explained this to her, apologized for the delay, but will ask [facility] to contact them ASAP. Pt's wife was grateful and will call me back next week if she has not heard back from [facility].

## 2017-10-26 NOTE — Assessment & Plan Note (Addendum)
Wt loss discussed Cut back on carbs Consider statins

## 2017-10-26 NOTE — Progress Notes (Signed)
Subjective:  Patient ID: Jerry Cohen, male    DOB: [birth year]  Age: 62 y.o. MRN: [id number]  CC: No chief complaint on file.   HPI Jerry Cohen presents for HTN, fatty liver, DM f/u  Outpatient Medications Prior to Visit  Medication Sig Dispense Refill  . amLODipine (NORVASC) 5 MG tablet Take 1 tablet (5 mg total) by mouth daily. 90 tablet 3  . aspirin 81 MG EC tablet Take 81 mg by mouth daily.      . Cholecalciferol (EQL VITAMIN D3) 1000 UNITS tablet Take 1,000 Units by mouth daily.      . clotrimazole-betamethasone (LOTRISONE) cream Apply topically 2 (two) times daily. 45 g 2  . esomeprazole (NEXIUM) 40 MG capsule TAKE 1 CAPSULE (40 MG TOTAL) BY MOUTH DAILY.INS ALLOWS 30 DAYS 90 capsule 1  . Icosapent Ethyl (VASCEPA) 1 g CAPS Take 2 capsules by mouth 2 (two) times daily. 120 capsule 11  . meloxicam (MOBIC) 15 MG tablet TAKE 1 TABLET (15 MG TOTAL) BY MOUTH DAILY AS NEEDED FOR PAIN (PC). 30 tablet 1  . metFORMIN (GLUCOPHAGE) 500 MG tablet Take 1 tablet (500 mg total) by mouth 2 (two) times daily with a meal. 180 tablet 3  . rOPINIRole (REQUIP) 1 MG tablet TAKE 1 TABLET BY MOUTH AT BEDTIME AS NEEDED FOR RESTLESS LEGS 90 tablet 3  . triamcinolone cream (KENALOG) 0.5 % APPLY TOPICALLY 3 (THREE) TIMES DAILY. 30 g 0  . valsartan-hydrochlorothiazide (DIOVAN-HCT) 320-25 MG tablet Take 1 tablet by mouth daily. 90 tablet 3   No facility-administered medications prior to visit.     ROS Review of Systems  Constitutional: Negative for appetite change, fatigue and unexpected weight change.  HENT: Negative for congestion, nosebleeds, sneezing, sore throat and trouble swallowing.   Eyes: Negative for itching and visual disturbance.  Respiratory: Negative for cough.   Cardiovascular: Negative for chest pain, palpitations and leg swelling.  Gastrointestinal: Negative for abdominal distention, blood in stool, diarrhea and nausea.  Genitourinary: Negative for frequency and hematuria.    Musculoskeletal: Negative for back pain, gait problem, joint swelling and neck pain.  Skin: Negative for rash.  Neurological: Negative for dizziness, tremors, speech difficulty and weakness.  Psychiatric/Behavioral: Negative for agitation, dysphoric mood, sleep disturbance and suicidal ideas. The patient is not nervous/anxious.     Objective:  BP 132/84 (BP Location: Left Arm, Patient Position: Sitting, Cuff Size: Large)   Pulse (!) 54   Temp 98.4 F (36.9 C) (Oral)   Ht 5\' 7"  (1.702 m)   Wt 236 lb (107 kg)   SpO2 98%   BMI 36.96 kg/m   BP Readings from Last 3 Encounters:  10/26/17 132/84  07/31/17 128/78  06/29/17 124/72    Wt Readings from Last 3 Encounters:  10/26/17 236 lb (107 kg)  07/31/17 241 lb (109.3 kg)  06/29/17 239 lb (108.4 kg)    Physical Exam  Constitutional: He is oriented to person, place, and time. He appears well-developed. No distress.  NAD  HENT:  Mouth/Throat: Oropharynx is clear and moist.  Eyes: Pupils are equal, round, and reactive to light. Conjunctivae are normal.  Neck: Normal range of motion. No JVD present. No thyromegaly present.  Cardiovascular: Normal rate, regular rhythm, normal heart sounds and intact distal pulses. Exam reveals no gallop and no friction rub.  No murmur heard. Pulmonary/Chest: Effort normal and breath sounds normal. No respiratory distress. He has no wheezes. He has no rales. He exhibits no tenderness.  Abdominal: Soft. Bowel  sounds are normal. He exhibits no distension and no mass. There is no tenderness. There is no rebound and no guarding.  Musculoskeletal: Normal range of motion. He exhibits no edema or tenderness.  Lymphadenopathy:    He has no cervical adenopathy.  Neurological: He is alert and oriented to person, place, and time. He has normal reflexes. No cranial nerve deficit. He exhibits normal muscle tone. He displays a negative Romberg sign. Coordination and gait normal.  Skin: Skin is warm and dry. No rash  noted.  Psychiatric: He has a normal mood and affect. His behavior is normal. Judgment and thought content normal.    Lab Results  Component Value Date   WBC 7.6 12/05/2016   HGB 16.2 12/05/2016   HCT 46.7 12/05/2016   PLT 250.0 12/05/2016   GLUCOSE 203 (H) 07/06/2017   CHOL 226 (H) 07/06/2017   TRIG 199.0 (H) 07/06/2017   HDL 27.40 (L) 07/06/2017   LDLDIRECT 121.0 12/05/2016   LDLCALC 158 (H) 07/06/2017   ALT 102 (H) 07/06/2017   AST 50 (H) 07/06/2017   NA 137 07/06/2017   K 3.8 07/06/2017   CL 100 07/06/2017   CREATININE 1.05 07/06/2017   BUN 12 07/06/2017   CO2 29 07/06/2017   TSH 1.16 12/05/2016   PSA 0.31 12/05/2016   HGBA1C 7.7 (H) 07/06/2017    US Abdomen Complete  Result Date: 09/14/2017 CLINICAL DATA:  Increased LFT's EXAM: ABDOMEN ULTRASOUND COMPLETE COMPARISON:  None. FINDINGS: Gallbladder: No gallstones or wall thickening visualized. No sonographic Murphy sign noted by sonographer. Common bile duct: Diameter: 3.3 mm Liver: No focal lesion identified. Increased hepatic parenchymal echogenicity. Portal vein is patent on color Doppler imaging with normal direction of blood flow towards the liver. IVC: Limited visualization secondary to overlying bowel gas. Pancreas: Limited visualization secondary to overlying bowel gas. Spleen: Size and appearance within normal limits. Right Kidney: Length: 11.8 cm. Echogenicity within normal limits. No mass or hydronephrosis visualized. Left Kidney: Length: 13.5 cm. Echogenicity within normal limits. No solid mass or hydronephrosis visualized. 1.9 x 1.8 x 1.5 cm hypoechoic left renal mass likely reflecting a cyst. Abdominal aorta: No aneurysm visualized. Other findings: None. IMPRESSION: 1. No cholelithiasis or sonographic evidence of acute cholecystitis. 2. Increased hepatic echogenicity as can be seen with hepatic steatosis. No focal hepatic mass. Electronically Signed   By: Kathreen Devoid   On: 09/14/2017 16:02    Assessment & Plan:    There are no diagnoses linked to this encounter. I am having Jerry Cohen maintain his aspirin, Cholecalciferol, valsartan-hydrochlorothiazide, amLODipine, clotrimazole-betamethasone, meloxicam, esomeprazole, Icosapent Ethyl, triamcinolone cream, rOPINIRole, and metFORMIN.  No orders of the defined types were placed in this encounter.    Follow-up: No follow-ups on file.  Walker Kehr, MD

## 2017-10-26 NOTE — Assessment & Plan Note (Signed)
Wt loss Consider statins

## 2017-10-26 NOTE — Assessment & Plan Note (Signed)
Better  

## 2017-11-05 DIAGNOSIS — G4733 Obstructive sleep apnea (adult) (pediatric): Secondary | ICD-10-CM | POA: Diagnosis not present

## 2017-12-06 DIAGNOSIS — G4733 Obstructive sleep apnea (adult) (pediatric): Secondary | ICD-10-CM | POA: Diagnosis not present

## 2017-12-25 ENCOUNTER — Ambulatory Visit: Payer: Self-pay | Admitting: Neurology

## 2018-01-05 DIAGNOSIS — G4733 Obstructive sleep apnea (adult) (pediatric): Secondary | ICD-10-CM | POA: Diagnosis not present

## 2018-01-28 ENCOUNTER — Ambulatory Visit: Payer: Commercial Managed Care - PPO | Admitting: Internal Medicine

## 2018-02-05 DIAGNOSIS — G4733 Obstructive sleep apnea (adult) (pediatric): Secondary | ICD-10-CM | POA: Diagnosis not present

## 2018-02-21 ENCOUNTER — Ambulatory Visit: Payer: Self-pay | Admitting: Neurology

## 2018-03-01 ENCOUNTER — Ambulatory Visit: Payer: Commercial Managed Care - PPO | Admitting: Internal Medicine

## 2018-04-18 ENCOUNTER — Ambulatory Visit: Payer: Commercial Managed Care - PPO | Admitting: Internal Medicine

## 2018-04-18 ENCOUNTER — Other Ambulatory Visit (INDEPENDENT_AMBULATORY_CARE_PROVIDER_SITE_OTHER): Payer: Commercial Managed Care - PPO

## 2018-04-18 ENCOUNTER — Ambulatory Visit (INDEPENDENT_AMBULATORY_CARE_PROVIDER_SITE_OTHER)
Admission: RE | Admit: 2018-04-18 | Discharge: 2018-04-18 | Disposition: A | Discharge status: Home / Self Care | Payer: Commercial Managed Care - PPO | Source: Ambulatory Visit | Attending: Internal Medicine | Admitting: Internal Medicine

## 2018-04-18 ENCOUNTER — Encounter: Payer: Self-pay | Admitting: Internal Medicine

## 2018-04-18 VITALS — BP 138/74 | HR 69 | Temp 98.2°F | Ht 67.0 in | Wt 227.0 lb

## 2018-04-18 DIAGNOSIS — E785 Hyperlipidemia, unspecified: Secondary | ICD-10-CM

## 2018-04-18 DIAGNOSIS — I10 Essential (primary) hypertension: Secondary | ICD-10-CM | POA: Diagnosis not present

## 2018-04-18 DIAGNOSIS — M5412 Radiculopathy, cervical region: Secondary | ICD-10-CM | POA: Diagnosis not present

## 2018-04-18 DIAGNOSIS — M542 Cervicalgia: Secondary | ICD-10-CM | POA: Diagnosis not present

## 2018-04-18 DIAGNOSIS — R739 Hyperglycemia, unspecified: Secondary | ICD-10-CM

## 2018-04-18 LAB — LIPID PANEL
CHOL/HDL RATIO: 7
Cholesterol: 208 mg/dL — ABNORMAL HIGH (ref 0–200)
HDL: 29.3 mg/dL — ABNORMAL LOW (ref >39.00)
LDL Cholesterol: 144 mg/dL — ABNORMAL HIGH (ref 0–99)
NonHDL: 178.9
Triglycerides: 176 mg/dL — ABNORMAL HIGH (ref 0.0–149.0)
VLDL: 35.2 mg/dL (ref 0.0–40.0)

## 2018-04-18 LAB — HEPATIC FUNCTION PANEL
ALBUMIN: 4.7 g/dL (ref 3.5–5.2)
ALK PHOS: 67 U/L (ref 39–117)
ALT: 81 U/L — ABNORMAL HIGH (ref 0–53)
AST: 47 U/L — AB (ref 0–37)
BILIRUBIN DIRECT: 0.2 mg/dL (ref 0.0–0.3)
BILIRUBIN TOTAL: 1.5 mg/dL — AB (ref 0.2–1.2)
Total Protein: 7.9 g/dL (ref 6.0–8.3)

## 2018-04-18 LAB — BASIC METABOLIC PANEL
BUN: 12 mg/dL (ref 6–23)
CO2: 27 mEq/L (ref 19–32)
Calcium: 9.7 mg/dL (ref 8.4–10.5)
Chloride: 101 mEq/L (ref 96–112)
Creatinine, Ser: 0.93 mg/dL (ref 0.40–1.50)
GFR: 86.84 mL/min (ref >60.00)
Glucose, Bld: 124 mg/dL — ABNORMAL HIGH (ref 70–99)
POTASSIUM: 3.2 meq/L — AB (ref 3.5–5.1)
Sodium: 138 mEq/L (ref 135–145)

## 2018-04-18 LAB — HEMOGLOBIN A1C: Hgb A1c MFr Bld: 8.6 % — ABNORMAL HIGH (ref 4.6–6.5)

## 2018-04-18 MED ORDER — REPAGLINIDE 1 MG PO TABS: 1.0000 mg | ORAL_TABLET | Freq: Three times a day (TID) | ORAL | 11 refills | Status: DC

## 2018-04-18 MED ORDER — PREDNISONE 10 MG PO TABS: ORAL_TABLET | ORAL | 1 refills | Status: DC

## 2018-04-18 NOTE — Addendum Note (Signed)
Addended by: Karren Cobble on: 04/18/2018 02:09 PM   Modules accepted: Orders

## 2018-04-18 NOTE — Progress Notes (Signed)
Subjective:  Patient ID: Jerry Cohen, male    DOB: [birth year]  Age: 62 y.o. MRN: [id number]  CC: No chief complaint on file.   HPI Jerry Cohen presents for HTN, DM, GERD f/u C/o R shoulder pain x 2 weeks; R inner arm is painful and numb, electric. Worse at night. He is L handed   Outpatient Medications Prior to Visit  Medication Sig Dispense Refill  . amLODipine (NORVASC) 5 MG tablet Take 1 tablet (5 mg total) by mouth daily. 90 tablet 3  . aspirin 81 MG EC tablet Take 81 mg by mouth daily.      . Cholecalciferol (EQL VITAMIN D3) 1000 UNITS tablet Take 1,000 Units by mouth daily.      . clotrimazole-betamethasone (LOTRISONE) cream Apply topically 2 (two) times daily. 45 g 2  . esomeprazole (NEXIUM) 40 MG capsule TAKE 1 CAPSULE (40 MG TOTAL) BY MOUTH DAILY.INS ALLOWS 30 DAYS 90 capsule 1  . Icosapent Ethyl (VASCEPA) 1 g CAPS Take 2 capsules by mouth 2 (two) times daily. 120 capsule 11  . meloxicam (MOBIC) 15 MG tablet TAKE 1 TABLET (15 MG TOTAL) BY MOUTH DAILY AS NEEDED FOR PAIN (PC). 30 tablet 1  . metFORMIN (GLUCOPHAGE XR) 750 MG 24 hr tablet Take 2 tablets (1,500 mg total) by mouth daily with breakfast. 60 tablet 11  . rOPINIRole (REQUIP) 1 MG tablet TAKE 1 TABLET BY MOUTH AT BEDTIME AS NEEDED FOR RESTLESS LEGS 90 tablet 3  . triamcinolone cream (KENALOG) 0.5 % APPLY TOPICALLY 3 (THREE) TIMES DAILY. 30 g 0  . valsartan-hydrochlorothiazide (DIOVAN-HCT) 320-25 MG tablet Take 1 tablet by mouth daily. 90 tablet 3   No facility-administered medications prior to visit.     ROS: Review of Systems  Constitutional: Negative for appetite change, fatigue and unexpected weight change.  HENT: Negative for congestion, nosebleeds, sneezing, sore throat and trouble swallowing.   Eyes: Negative for itching and visual disturbance.  Respiratory: Negative for cough.   Cardiovascular: Negative for chest pain, palpitations and leg swelling.  Gastrointestinal: Negative for abdominal  distention, blood in stool, diarrhea and nausea.  Genitourinary: Negative for frequency and hematuria.  Musculoskeletal: Negative for back pain, gait problem, joint swelling and neck pain.  Skin: Negative for rash.  Neurological: Negative for dizziness, tremors, speech difficulty and weakness.  Psychiatric/Behavioral: Negative for agitation, dysphoric mood, sleep disturbance and suicidal ideas. The patient is not nervous/anxious.     Objective:  BP 138/74 (BP Location: Left Arm, Patient Position: Sitting, Cuff Size: Large)   Pulse 69   Temp 98.2 F (36.8 C) (Oral)   Ht 5\' 7"  (1.702 m)   Wt 227 lb (103 kg)   SpO2 97%   BMI 35.55 kg/m   BP Readings from Last 3 Encounters:  04/18/18 138/74  10/26/17 132/84  07/31/17 128/78    Wt Readings from Last 3 Encounters:  04/18/18 227 lb (103 kg)  10/26/17 236 lb (107 kg)  07/31/17 241 lb (109.3 kg)    Physical Exam  Constitutional: He is oriented to person, place, and time. He appears well-developed. No distress.  NAD  HENT:  Mouth/Throat: Oropharynx is clear and moist.  Eyes: Pupils are equal, round, and reactive to light. Conjunctivae are normal.  Neck: Normal range of motion. No JVD present. No thyromegaly present.  Cardiovascular: Normal rate, regular rhythm, normal heart sounds and intact distal pulses. Exam reveals no gallop and no friction rub.  No murmur heard. Pulmonary/Chest: Effort normal and breath sounds normal.  No respiratory distress. He has no wheezes. He has no rales. He exhibits no tenderness.  Abdominal: Soft. Bowel sounds are normal. He exhibits no distension and no mass. There is no tenderness. There is no rebound and no guarding.  Musculoskeletal: Normal range of motion. He exhibits no edema or tenderness.  Lymphadenopathy:    He has no cervical adenopathy.  Neurological: He is alert and oriented to person, place, and time. He has normal reflexes. No cranial nerve deficit. He exhibits normal muscle tone. He  displays a negative Romberg sign. Coordination and gait normal.  Skin: Skin is warm and dry. No rash noted.  Psychiatric: He has a normal mood and affect. His behavior is normal. Judgment and thought content normal.  numb area - ulnar forearm MS ok  Lab Results  Component Value Date   WBC 7.6 12/05/2016   HGB 16.2 12/05/2016   HCT 46.7 12/05/2016   PLT 250.0 12/05/2016   GLUCOSE 149 (H) 10/26/2017   CHOL 226 (H) 07/06/2017   TRIG 199.0 (H) 07/06/2017   HDL 27.40 (L) 07/06/2017   LDLDIRECT 121.0 12/05/2016   LDLCALC 158 (H) 07/06/2017   ALT 84 (H) 10/26/2017   AST 49 (H) 10/26/2017   NA 137 10/26/2017   K 3.7 10/26/2017   CL 103 10/26/2017   CREATININE 0.85 10/26/2017   BUN 11 10/26/2017   CO2 26 10/26/2017   TSH 1.16 12/05/2016   PSA 0.31 12/05/2016   HGBA1C 8.0 (H) 10/26/2017    US Abdomen Complete  Result Date: 09/14/2017 CLINICAL DATA:  Increased LFT's EXAM: ABDOMEN ULTRASOUND COMPLETE COMPARISON:  None. FINDINGS: Gallbladder: No gallstones or wall thickening visualized. No sonographic Murphy sign noted by sonographer. Common bile duct: Diameter: 3.3 mm Liver: No focal lesion identified. Increased hepatic parenchymal echogenicity. Portal vein is patent on color Doppler imaging with normal direction of blood flow towards the liver. IVC: Limited visualization secondary to overlying bowel gas. Pancreas: Limited visualization secondary to overlying bowel gas. Spleen: Size and appearance within normal limits. Right Kidney: Length: 11.8 cm. Echogenicity within normal limits. No mass or hydronephrosis visualized. Left Kidney: Length: 13.5 cm. Echogenicity within normal limits. No solid mass or hydronephrosis visualized. 1.9 x 1.8 x 1.5 cm hypoechoic left renal mass likely reflecting a cyst. Abdominal aorta: No aneurysm visualized. Other findings: None. IMPRESSION: 1. No cholelithiasis or sonographic evidence of acute cholecystitis. 2. Increased hepatic echogenicity as can be seen with  hepatic steatosis. No focal hepatic mass. Electronically Signed   By: Kathreen Devoid   On: 09/14/2017 16:02    Assessment & Plan:   There are no diagnoses linked to this encounter.   No orders of the defined types were placed in this encounter.    Follow-up: No follow-ups on file.  Walker Kehr, MD

## 2018-04-18 NOTE — Patient Instructions (Signed)
Rice sock Contour pillow

## 2018-04-18 NOTE — Assessment & Plan Note (Signed)
R shoulder pain x 2 weeks; R inner arm is painful and numb, electric. Worse at night. He is L handed Rice sock Contour pillow X ray Prednisone 10 mg: take 4 tabs a day x 3 days; then 3 tabs a day x 4 days; then 2 tabs a day x 4 days, then 1 tab a day x 6 days, then stop. Take pc.

## 2018-04-24 ENCOUNTER — Other Ambulatory Visit: Payer: Self-pay | Admitting: Internal Medicine

## 2018-04-24 MED ORDER — PRAVASTATIN SODIUM 20 MG PO TABS: 20.0000 mg | ORAL_TABLET | Freq: Every day | ORAL | 11 refills | Status: DC

## 2018-05-07 ENCOUNTER — Ambulatory Visit: Payer: Commercial Managed Care - PPO | Admitting: Internal Medicine

## 2018-05-22 ENCOUNTER — Ambulatory Visit: Payer: Commercial Managed Care - PPO | Admitting: Internal Medicine

## 2018-06-03 ENCOUNTER — Other Ambulatory Visit: Payer: Self-pay | Admitting: Internal Medicine

## 2018-06-03 ENCOUNTER — Ambulatory Visit (INDEPENDENT_AMBULATORY_CARE_PROVIDER_SITE_OTHER): Payer: Commercial Managed Care - PPO | Admitting: Internal Medicine

## 2018-06-03 ENCOUNTER — Other Ambulatory Visit (INDEPENDENT_AMBULATORY_CARE_PROVIDER_SITE_OTHER): Payer: Commercial Managed Care - PPO

## 2018-06-03 ENCOUNTER — Encounter: Payer: Self-pay | Admitting: Internal Medicine

## 2018-06-03 VITALS — BP 142/84 | HR 63 | Temp 98.3°F | Ht 67.0 in | Wt 232.0 lb

## 2018-06-03 DIAGNOSIS — Z0000 Encounter for general adult medical examination without abnormal findings: Secondary | ICD-10-CM

## 2018-06-03 DIAGNOSIS — R202 Paresthesia of skin: Secondary | ICD-10-CM

## 2018-06-03 DIAGNOSIS — R29898 Other symptoms and signs involving the musculoskeletal system: Secondary | ICD-10-CM

## 2018-06-03 DIAGNOSIS — R7989 Other specified abnormal findings of blood chemistry: Secondary | ICD-10-CM

## 2018-06-03 DIAGNOSIS — M79609 Pain in unspecified limb: Secondary | ICD-10-CM | POA: Diagnosis not present

## 2018-06-03 DIAGNOSIS — R945 Abnormal results of liver function studies: Secondary | ICD-10-CM

## 2018-06-03 DIAGNOSIS — E785 Hyperlipidemia, unspecified: Secondary | ICD-10-CM

## 2018-06-03 LAB — CBC WITH DIFFERENTIAL/PLATELET
BASOS ABS: 0.1 10*3/uL (ref 0.0–0.1)
BASOS PCT: 0.6 % (ref 0.0–3.0)
EOS PCT: 1.2 % (ref 0.0–5.0)
Eosinophils Absolute: 0.1 10*3/uL (ref 0.0–0.7)
HCT: 45.2 % (ref 39.0–52.0)
Hemoglobin: 15.8 g/dL (ref 13.0–17.0)
LYMPHS ABS: 1.4 10*3/uL (ref 0.7–4.0)
LYMPHS PCT: 14 % (ref 12.0–46.0)
MCHC: 34.9 g/dL (ref 30.0–36.0)
MCV: 90.9 fl (ref 78.0–100.0)
MONOS PCT: 5.5 % (ref 3.0–12.0)
Monocytes Absolute: 0.6 10*3/uL (ref 0.1–1.0)
NEUTROS ABS: 8 10*3/uL — AB (ref 1.4–7.7)
NEUTROS PCT: 78.7 % — AB (ref 43.0–77.0)
Platelets: 248 10*3/uL (ref 150.0–400.0)
RBC: 4.98 Mil/uL (ref 4.22–5.81)
RDW: 13.4 % (ref 11.5–15.5)
WBC: 10.2 10*3/uL (ref 4.0–10.5)

## 2018-06-03 LAB — HEPATIC FUNCTION PANEL
ALBUMIN: 4.3 g/dL (ref 3.5–5.2)
ALT: 97 U/L — AB (ref 0–53)
AST: 51 U/L — ABNORMAL HIGH (ref 0–37)
Alkaline Phosphatase: 55 U/L (ref 39–117)
BILIRUBIN TOTAL: 1.6 mg/dL — AB (ref 0.2–1.2)
Bilirubin, Direct: 0.2 mg/dL (ref 0.0–0.3)
Total Protein: 6.7 g/dL (ref 6.0–8.3)

## 2018-06-03 LAB — LIPID PANEL
CHOLESTEROL: 212 mg/dL — AB (ref 0–200)
HDL: 31.7 mg/dL — AB (ref >39.00)
NONHDL: 180.49
TRIGLYCERIDES: 282 mg/dL — AB (ref 0.0–149.0)
Total CHOL/HDL Ratio: 7
VLDL: 56.4 mg/dL — ABNORMAL HIGH (ref 0.0–40.0)

## 2018-06-03 LAB — BASIC METABOLIC PANEL
BUN: 19 mg/dL (ref 6–23)
CALCIUM: 9.5 mg/dL (ref 8.4–10.5)
CO2: 25 mEq/L (ref 19–32)
Chloride: 103 mEq/L (ref 96–112)
Creatinine, Ser: 0.82 mg/dL (ref 0.40–1.50)
GFR: 100.37 mL/min (ref >60.00)
GLUCOSE: 152 mg/dL — AB (ref 70–99)
POTASSIUM: 3.8 meq/L (ref 3.5–5.1)
SODIUM: 138 meq/L (ref 135–145)

## 2018-06-03 LAB — URINALYSIS
BILIRUBIN URINE: NEGATIVE
Hgb urine dipstick: NEGATIVE
KETONES UR: NEGATIVE
LEUKOCYTES UA: NEGATIVE
Nitrite: NEGATIVE
PH: 5.5 (ref 5.0–8.0)
Specific Gravity, Urine: 1.025 (ref 1.000–1.030)
Urine Glucose: 250 — AB
Urobilinogen, UA: 0.2 (ref 0.0–1.0)

## 2018-06-03 LAB — LDL CHOLESTEROL, DIRECT: Direct LDL: 143 mg/dL

## 2018-06-03 LAB — VITAMIN B12: Vitamin B-12: 463 pg/mL (ref 211–911)

## 2018-06-03 LAB — VITAMIN D 25 HYDROXY (VIT D DEFICIENCY, FRACTURES): VITD: 26.25 ng/mL — ABNORMAL LOW (ref 30.00–100.00)

## 2018-06-03 LAB — PSA: PSA: 0.25 ng/mL (ref 0.10–4.00)

## 2018-06-03 LAB — TSH: TSH: 0.99 u[IU]/mL (ref 0.35–4.50)

## 2018-06-03 MED ORDER — CHOLECALCIFEROL 25 MCG (1000 UT) PO TABS: 2000.0000 [IU] | ORAL_TABLET | Freq: Every day | ORAL | 6 refills | Status: AC

## 2018-06-03 MED ORDER — PREDNISONE 10 MG PO TABS: ORAL_TABLET | ORAL | 1 refills | Status: DC

## 2018-06-03 NOTE — Progress Notes (Signed)
Established Patient Office Visit  Subjective:  Patient ID: Jerry Cohen, male    DOB: 1956/02/18  Age: 62 y.o. MRN: [id number]  CC: No chief complaint on file.   HPI Jerry Cohen presents for a well exam C/o R arm and R forearm pain x 2 mo and weakness- new x2 weeks ago  Past Medical History:  Diagnosis Date  . Dizziness 2012  . ED (erectile dysfunction)   . GERD (gastroesophageal reflux disease)   . Hx of colonic polyps    Dr Fuller Plan  . Hyperlipidemia   . Hypertension   . OSA on CPAP   . Osteoarthritis   . Sleep apnea     Past Surgical History:  Procedure Laterality Date  . APPENDECTOMY  1996  . COLONOSCOPY  2015  . KNEE ARTHROSCOPY Left 2009  . LASIK Bilateral 2007  . SHOULDER ARTHROSCOPY W/ ROTATOR CUFF REPAIR Left 2010    Family History  Problem Relation Age of Onset  . Cancer Father        kidney and prostate   . Colon cancer Father 55  . Coronary artery disease Neg Hx   . Cancer Other        colon- <60  . Colon cancer Other 30  . Mental illness Mother        dementia  . Cancer Sister        colon cancer  . Colon cancer Sister 80  . Colon cancer Sister 81    Social History   Socioeconomic History  . Marital status: Married    Spouse name: Not on file  . Number of children: Not on file  . Years of education: Not on file  . Highest education level: Not on file  Occupational History  . Not on file  Social Needs  . Financial resource strain: Not on file  . Food insecurity:    Worry: Not on file    Inability: Not on file  . Transportation needs:    Medical: Not on file    Non-medical: Not on file  Tobacco Use  . Smoking status: Current Some Day Smoker    Types: Cigars  . Smokeless tobacco: Never Used  . Tobacco comment: quit smoking 30 years ago.  Substance and Sexual Activity  . Alcohol use: Yes    Alcohol/week: 0.0 standard drinks    Comment: about 2 times per month  . Drug use: No  . Sexual activity: Yes  Lifestyle  . Physical  activity:    Days per week: Not on file    Minutes per session: Not on file  . Stress: Not on file  Relationships  . Social connections:    Talks on phone: Not on file    Gets together: Not on file    Attends religious service: Not on file    Active member of club or organization: Not on file    Attends meetings of clubs or organizations: Not on file    Relationship status: Not on file  . Intimate partner violence:    Fear of current or ex partner: Not on file    Emotionally abused: Not on file    Physically abused: Not on file    Forced sexual activity: Not on file  Other Topics Concern  . Not on file  Social History Narrative  . Not on file    Outpatient Medications Prior to Visit  Medication Sig Dispense Refill  . amLODipine (NORVASC) 5 MG tablet Take 1  tablet (5 mg total) by mouth daily. 90 tablet 3  . aspirin 81 MG EC tablet Take 81 mg by mouth daily.      . Cholecalciferol (EQL VITAMIN D3) 1000 UNITS tablet Take 1,000 Units by mouth daily.      . clotrimazole-betamethasone (LOTRISONE) cream Apply topically 2 (two) times daily. 45 g 2  . esomeprazole (NEXIUM) 40 MG capsule TAKE 1 CAPSULE (40 MG TOTAL) BY MOUTH DAILY.INS ALLOWS 30 DAYS 90 capsule 1  . Icosapent Ethyl (VASCEPA) 1 g CAPS Take 2 capsules by mouth 2 (two) times daily. 120 capsule 11  . meloxicam (MOBIC) 15 MG tablet TAKE 1 TABLET (15 MG TOTAL) BY MOUTH DAILY AS NEEDED FOR PAIN (PC). 30 tablet 1  . metFORMIN (GLUCOPHAGE XR) 750 MG 24 hr tablet Take 2 tablets (1,500 mg total) by mouth daily with breakfast. 60 tablet 11  . pravastatin (PRAVACHOL) 20 MG tablet Take 1 tablet (20 mg total) by mouth daily. 30 tablet 11  . repaglinide (PRANDIN) 1 MG tablet Take 1 tablet (1 mg total) by mouth 3 (three) times daily before meals. 90 tablet 11  . rOPINIRole (REQUIP) 1 MG tablet TAKE 1 TABLET BY MOUTH AT BEDTIME AS NEEDED FOR RESTLESS LEGS 90 tablet 3  . triamcinolone cream (KENALOG) 0.5 % APPLY TOPICALLY 3 (THREE) TIMES  DAILY. 30 g 0  . valsartan-hydrochlorothiazide (DIOVAN-HCT) 320-25 MG tablet Take 1 tablet by mouth daily. 90 tablet 3  . predniSONE (DELTASONE) 10 MG tablet Prednisone 10 mg: take 4 tabs a day x 3 days; then 3 tabs a day x 4 days; then 2 tabs a day x 4 days, then 1 tab a day x 6 days, then stop. Take pc. 38 tablet 1   No facility-administered medications prior to visit.     Allergies  Allergen Reactions  . Atorvastatin Other (See Comments)    REACTION: aches in muscles    ROS Review of Systems  Constitutional: Negative for appetite change, fatigue and unexpected weight change.  HENT: Negative for congestion, nosebleeds, sneezing, sore throat and trouble swallowing.   Eyes: Negative for itching and visual disturbance.  Respiratory: Negative for cough.   Cardiovascular: Negative for chest pain, palpitations and leg swelling.  Gastrointestinal: Negative for abdominal distention, blood in stool, diarrhea and nausea.  Genitourinary: Negative for frequency and hematuria.  Musculoskeletal: Positive for arthralgias, neck pain and neck stiffness. Negative for back pain, gait problem and joint swelling.  Skin: Negative for rash.  Neurological: Positive for weakness and numbness. Negative for dizziness, tremors and speech difficulty.  Psychiatric/Behavioral: Negative for agitation, dysphoric mood, sleep disturbance and suicidal ideas. The patient is not nervous/anxious.       Objective:    Physical Exam  Constitutional: He is oriented to person, place, and time. He appears well-developed and well-nourished. No distress.  HENT:  Head: Normocephalic and atraumatic.  Right Ear: External ear normal.  Left Ear: External ear normal.  Nose: Nose normal.  Mouth/Throat: Oropharynx is clear and moist. No oropharyngeal exudate.  Eyes: Pupils are equal, round, and reactive to light. Conjunctivae and EOM are normal. Right eye exhibits no discharge. Left eye exhibits no discharge. No scleral icterus.    Neck: Normal range of motion. Neck supple. No JVD present. No tracheal deviation present. No thyromegaly present.  Cardiovascular: Normal rate, regular rhythm, normal heart sounds and intact distal pulses. Exam reveals no gallop and no friction rub.  No murmur heard. Pulmonary/Chest: Effort normal and breath sounds normal. No stridor. No  respiratory distress. He has no wheezes. He has no rales. He exhibits no tenderness.  Abdominal: Soft. Bowel sounds are normal. He exhibits no distension and no mass. There is no tenderness. There is no rebound and no guarding.  Genitourinary: Rectum normal, prostate normal and penis normal. Rectal exam shows guaiac negative stool. No penile tenderness.  Musculoskeletal: Normal range of motion. He exhibits no edema or tenderness.  Lymphadenopathy:    He has no cervical adenopathy.  Neurological: He is alert and oriented to person, place, and time. He has normal reflexes. No cranial nerve deficit. He exhibits normal muscle tone. Coordination normal.  Skin: Skin is warm and dry. No rash noted. He is not diaphoretic. No erythema. No pallor.  Psychiatric: He has a normal mood and affect. His behavior is normal. Judgment and thought content normal.  R grip, shoulder muscles 5-/5  BP (!) 142/84 (BP Location: Left Arm, Patient Position: Sitting, Cuff Size: Large)   Pulse 63   Temp 98.3 F (36.8 C) (Oral)   Ht 5\' 7"  (1.702 m)   Wt 232 lb (105.2 kg)   SpO2 95%   BMI 36.34 kg/m  Wt Readings from Last 3 Encounters:  06/03/18 232 lb (105.2 kg)  04/18/18 227 lb (103 kg)  10/26/17 236 lb (107 kg)     Health Maintenance Due  Topic Date Due  . HIV Screening  11/27/1968    There are no preventive care reminders to display for this patient.  Lab Results  Component Value Date   TSH 1.16 12/05/2016   Lab Results  Component Value Date   WBC 7.6 12/05/2016   HGB 16.2 12/05/2016   HCT 46.7 12/05/2016   MCV 91.2 12/05/2016   PLT 250.0 12/05/2016   Lab  Results  Component Value Date   NA 138 04/18/2018   K 3.2 (L) 04/18/2018   CO2 27 04/18/2018   GLUCOSE 124 (H) 04/18/2018   BUN 12 04/18/2018   CREATININE 0.93 04/18/2018   BILITOT 1.5 (H) 04/18/2018   ALKPHOS 67 04/18/2018   AST 47 (H) 04/18/2018   ALT 81 (H) 04/18/2018   PROT 7.9 04/18/2018   ALBUMIN 4.7 04/18/2018   CALCIUM 9.7 04/18/2018   GFR 86.84 04/18/2018   Lab Results  Component Value Date   CHOL 208 (H) 04/18/2018   Lab Results  Component Value Date   HDL 29.30 (L) 04/18/2018   Lab Results  Component Value Date   LDLCALC 144 (H) 04/18/2018   Lab Results  Component Value Date   TRIG 176.0 (H) 04/18/2018   Lab Results  Component Value Date   CHOLHDL 7 04/18/2018   Lab Results  Component Value Date   HGBA1C 8.6 (H) 04/18/2018      Assessment & Plan:   Problem List Items Addressed This Visit    None      No orders of the defined types were placed in this encounter.   Follow-up: No follow-ups on file.    Walker Kehr, MD

## 2018-06-03 NOTE — Assessment & Plan Note (Signed)
We discussed age appropriate health related issues, including available/recomended screening tests and vaccinations. We discussed a need for adhering to healthy diet and exercise. Labs were ordered to be later reviewed . All questions were answered.  Pt declined Shingrix  offered a cardiac CT scan for calcium scoring

## 2018-06-03 NOTE — Patient Instructions (Signed)

## 2018-06-03 NOTE — Assessment & Plan Note (Addendum)
Cervical MRI Repeat Prednisone 10 mg: take 4 tabs a day x 3 days; then 3 tabs a day x 4 days; then 2 tabs a day x 4 days, then 1 tab a day x 6 days, then stop. Take pc.  - cerv radiculopathy?

## 2018-06-03 NOTE — Assessment & Plan Note (Signed)
MRI Repeat Prednisone 10 mg: take 4 tabs a day x 3 days; then 3 tabs a day x 4 days; then 2 tabs a day x 4 days, then 1 tab a day x 6 days, then stop. Take pc.

## 2018-06-03 NOTE — Assessment & Plan Note (Signed)
Labs

## 2018-06-16 ENCOUNTER — Ambulatory Visit
Admission: RE | Admit: 2018-06-16 | Discharge: 2018-06-16 | Disposition: A | Discharge status: Home / Self Care | Payer: Commercial Managed Care - PPO | Source: Ambulatory Visit | Attending: Internal Medicine | Admitting: Internal Medicine

## 2018-06-16 DIAGNOSIS — R202 Paresthesia of skin: Secondary | ICD-10-CM

## 2018-06-16 DIAGNOSIS — R29898 Other symptoms and signs involving the musculoskeletal system: Secondary | ICD-10-CM

## 2018-06-16 DIAGNOSIS — M79609 Pain in unspecified limb: Secondary | ICD-10-CM

## 2018-06-17 ENCOUNTER — Other Ambulatory Visit: Payer: Self-pay | Admitting: Internal Medicine

## 2018-06-17 DIAGNOSIS — M79609 Pain in unspecified limb: Secondary | ICD-10-CM

## 2018-06-17 DIAGNOSIS — R202 Paresthesia of skin: Secondary | ICD-10-CM

## 2018-06-17 DIAGNOSIS — R29898 Other symptoms and signs involving the musculoskeletal system: Secondary | ICD-10-CM

## 2018-06-17 DIAGNOSIS — M5412 Radiculopathy, cervical region: Secondary | ICD-10-CM

## 2018-06-25 DIAGNOSIS — M4712 Other spondylosis with myelopathy, cervical region: Secondary | ICD-10-CM | POA: Diagnosis not present

## 2018-06-25 DIAGNOSIS — M5020 Other cervical disc displacement, unspecified cervical region: Secondary | ICD-10-CM | POA: Diagnosis not present

## 2018-06-25 DIAGNOSIS — M542 Cervicalgia: Secondary | ICD-10-CM | POA: Diagnosis not present

## 2018-07-01 ENCOUNTER — Ambulatory Visit (INDEPENDENT_AMBULATORY_CARE_PROVIDER_SITE_OTHER)
Admission: RE | Admit: 2018-07-01 | Discharge: 2018-07-01 | Disposition: A | Discharge status: Home / Self Care | Payer: Self-pay | Source: Ambulatory Visit | Attending: Internal Medicine | Admitting: Internal Medicine

## 2018-07-01 DIAGNOSIS — E785 Hyperlipidemia, unspecified: Secondary | ICD-10-CM

## 2018-07-03 ENCOUNTER — Other Ambulatory Visit: Payer: Self-pay | Admitting: Internal Medicine

## 2018-07-03 DIAGNOSIS — I2510 Atherosclerotic heart disease of native coronary artery without angina pectoris: Secondary | ICD-10-CM

## 2018-07-13 ENCOUNTER — Other Ambulatory Visit: Payer: Self-pay | Admitting: Internal Medicine

## 2018-07-19 ENCOUNTER — Encounter: Payer: Self-pay | Admitting: Cardiology

## 2018-07-19 ENCOUNTER — Ambulatory Visit: Payer: Commercial Managed Care - PPO | Admitting: Cardiology

## 2018-07-19 ENCOUNTER — Encounter (INDEPENDENT_AMBULATORY_CARE_PROVIDER_SITE_OTHER): Payer: Self-pay

## 2018-07-19 VITALS — BP 136/84 | HR 63 | Ht 67.0 in | Wt 238.1 lb

## 2018-07-19 DIAGNOSIS — I2510 Atherosclerotic heart disease of native coronary artery without angina pectoris: Secondary | ICD-10-CM | POA: Diagnosis not present

## 2018-07-19 DIAGNOSIS — R0602 Shortness of breath: Secondary | ICD-10-CM

## 2018-07-19 DIAGNOSIS — I7121 Aneurysm of the ascending aorta, without rupture: Secondary | ICD-10-CM

## 2018-07-19 DIAGNOSIS — E785 Hyperlipidemia, unspecified: Secondary | ICD-10-CM | POA: Diagnosis not present

## 2018-07-19 DIAGNOSIS — I517 Cardiomegaly: Secondary | ICD-10-CM

## 2018-07-19 DIAGNOSIS — R931 Abnormal findings on diagnostic imaging of heart and coronary circulation: Secondary | ICD-10-CM

## 2018-07-19 DIAGNOSIS — I10 Essential (primary) hypertension: Secondary | ICD-10-CM | POA: Diagnosis not present

## 2018-07-19 DIAGNOSIS — I712 Thoracic aortic aneurysm, without rupture: Secondary | ICD-10-CM | POA: Diagnosis not present

## 2018-07-19 HISTORY — DX: Atherosclerotic heart disease of native coronary artery without angina pectoris: I25.10

## 2018-07-19 HISTORY — DX: Aneurysm of the ascending aorta, without rupture: I71.21

## 2018-07-19 HISTORY — DX: Thoracic aortic aneurysm, without rupture: I71.2

## 2018-07-19 MED ORDER — METOPROLOL TARTRATE 100 MG PO TABS: ORAL_TABLET | ORAL | 0 refills | Status: DC

## 2018-07-19 MED ORDER — ATORVASTATIN CALCIUM 80 MG PO TABS: 80.0000 mg | ORAL_TABLET | Freq: Every day | ORAL | 3 refills | Status: DC

## 2018-07-19 NOTE — Patient Instructions (Signed)
Medication Instructions:  Stop: Pravachol Start: Lipitor 80 mg, daily, by mouth  If you need a refill on your cardiac medications before your next appointment, please call your pharmacy.   Lab work: Future: BMET before cardiac CT and fasting labs on 08/26/18  If you have labs (blood work) drawn today and your tests are completely normal, you will receive your results only by: Marland Kitchen MyChart Message (if you have MyChart) OR . A paper copy in the mail If you have any lab test that is abnormal or we need to change your treatment, we will call you to review the results.  Testing/Procedures: Your physician has requested that you have an echocardiogram. Echocardiography is a painless test that uses sound waves to create images of your heart. It provides your doctor with information about the size and shape of your heart and how well your heart's chambers and valves are working. This procedure takes approximately one hour. There are no restrictions for this procedure.  Your physician has requested that you have cardiac CT. Cardiac computed tomography (CT) is a painless test that uses an x-ray machine to take clear, detailed pictures of your heart. For further information please visit [facility]. Please follow instruction sheet as given.  Your physician has requested that you have an abdominal aorta duplex. During this test, an ultrasound is used to evaluate the aorta. Allow 30 minutes for this exam. Do not eat after midnight the day before and avoid carbonated beverages  Follow-Up: At [facility], you and your health needs are our priority.  As part of our continuing mission to provide you with exceptional heart care, we have created designated Provider Care Teams.  These Care Teams include your primary Cardiologist (physician) and Advanced Practice Providers (APPs -  Physician Assistants and Nurse Practitioners) who all work together to provide you with the care you need, when you need it. You  will need a follow up appointment in 1 years.  Please call our office 2 months in advance to schedule this appointment.  You may see Dr. Radford Pax or one of the following Advanced Practice Providers on your designated Care Team:   [place], PA-C Melina Copa, PA-C . Ermalinda Barrios, PA-C

## 2018-07-19 NOTE — Progress Notes (Signed)
Cardiology Office Note    Date:  07/19/2018   ID:  Jerry Cohen, DOB 06-17-1956, MRN [id number]  PCP:  Cassandria Anger, MD  Cardiologist:  Fransico Him, MD   Chief Complaint  Patient presents with  . Coronary Artery Disease    History of Present Illness:  Jerry Cohen is a 63 y.o. male who is being seen today for the evaluation of CAD at the request of Plotnikov, Evie Lacks, MD.  This is a 63 year old male with a history of obstructive sleep apnea on CPAP, hypertension, hyperlipidemia and GERD.  He was recently seen for routine physical exam by his PCP and due to cardiac risk factors he recommended getting a coronary calcium score.  This was done showing a calcium score that was elevated at 284 which was 24 percentile for age and sex matched controls.  He was also noted to have a moderately dilated a sending aortic aneurysm at 46 mm.  Now referred for further evaluation.  He denies any chest pain or pressure,  PND, orthopnea, LE edema, dizziness, palpitations or syncope.  He has problems with chronic shortness of breath.  He is compliant with his meds and is tolerating meds with no SE.    Past Medical History:  Diagnosis Date  . Ascending aortic aneurysm ([place]) 07/19/2018  . Coronary artery calcification seen on CAT scan 07/19/2018  . Dizziness 2012  . ED (erectile dysfunction)   . GERD (gastroesophageal reflux disease)   . Hx of colonic polyps    Dr Fuller Plan  . Hyperlipidemia   . Hypertension   . OSA on CPAP   . Osteoarthritis     Past Surgical History:  Procedure Laterality Date  . APPENDECTOMY  1996  . COLONOSCOPY  2015  . KNEE ARTHROSCOPY Left 2009  . LASIK Bilateral 2007  . SHOULDER ARTHROSCOPY W/ ROTATOR CUFF REPAIR Left 2010    Current Medications: No outpatient medications have been marked as taking for the 07/19/18 encounter (Office Visit) with Sueanne Margarita, MD.    Allergies:   Atorvastatin   Social History   Socioeconomic History  . Marital  status: Married    Spouse name: Not on file  . Number of children: Not on file  . Years of education: Not on file  . Highest education level: Not on file  Occupational History  . Not on file  Social Needs  . Financial resource strain: Not on file  . Food insecurity:    Worry: Not on file    Inability: Not on file  . Transportation needs:    Medical: Not on file    Non-medical: Not on file  Tobacco Use  . Smoking status: Current Some Day Smoker    Types: Cigars  . Smokeless tobacco: Never Used  . Tobacco comment: quit smoking 30 years ago.  Substance and Sexual Activity  . Alcohol use: Yes    Alcohol/week: 0.0 standard drinks    Comment: about 2 times per month  . Drug use: No  . Sexual activity: Yes  Lifestyle  . Physical activity:    Days per week: Not on file    Minutes per session: Not on file  . Stress: Not on file  Relationships  . Social connections:    Talks on phone: Not on file    Gets together: Not on file    Attends religious service: Not on file    Active member of club or organization: Not on file  Attends meetings of clubs or organizations: Not on file    Relationship status: Not on file  Other Topics Concern  . Not on file  Social History Narrative  . Not on file     Family History:  The patient's family history includes Cancer in his father, sister, and another family member; Colon cancer (age of onset: 7) in an other family member; Colon cancer (age of onset: 51) in his father and sister; Colon cancer (age of onset: 62) in his sister; Mental illness in his mother.   ROS:   Please see the history of present illness.    ROS All other systems reviewed and are negative.  No flowsheet data found.   PHYSICAL EXAM:   VS:  There were no vitals taken for this visit.   GEN: Well nourished, well developed, in no acute distress  HEENT: normal  Neck: no JVD, carotid bruits, or masses Cardiac:RRR; no murmurs, rubs, or gallops,no edema.  Intact distal  pulses bilaterally.  Respiratory:  clear to auscultation bilaterally, normal work of breathing GI: soft, nontender, nondistended, + BS MS: no deformity or atrophy  Skin: warm and dry, no rash Neuro:  Alert and Oriented x 3, Strength and sensation are intact Psych: euthymic mood, full affect  Wt Readings from Last 3 Encounters:  06/03/18 232 lb (105.2 kg)  04/18/18 227 lb (103 kg)  10/26/17 236 lb (107 kg)      Studies/Labs Reviewed:   EKG:  EKG is not ordered today.    Recent Labs: 06/03/2018: ALT 97; BUN 19; Creatinine, Ser 0.82; Hemoglobin 15.8; Platelets 248.0; Potassium 3.8; Sodium 138; TSH 0.99   Lipid Panel    Component Value Date/Time   CHOL 212 (H) 06/03/2018 1008   TRIG 282.0 (H) 06/03/2018 1008   HDL 31.70 (L) 06/03/2018 1008   CHOLHDL 7 06/03/2018 1008   VLDL 56.4 (H) 06/03/2018 1008   LDLCALC 144 (H) 04/18/2018 1409   LDLDIRECT 143.0 06/03/2018 1008    Additional studies/ records that were reviewed today include:  Office notes from PCP and Chest CT for calcium score    ASSESSMENT:    1. Coronary artery calcification seen on CAT scan   2. Essential hypertension   3. Dyslipidemia   4. Ascending aortic aneurysm (HCC)      PLAN:  In order of problems listed above:  1. Coronary artery calcifications on CT -CT was done for evaluation of cardiac risk factors.  He has a coronary calcium score of 285 which is borderline high risk.  His cardiac risk factors include obesity, hypertension, ongoing tobacco use and hyperlipidemia.  I have recommended getting a coronary CTA to assess for obstructive CAD.  He should continue on aspirin 81 mg daily as well as statin.  I am also going to get a 2D echocardiogram to assess for LVH given his EKG demonstrating LVH as well as assess for other structural abnormalities that could cause shortness of breath.  I strongly courage him to stop smoking cigars.  2.  Hypertension -he is well controlled on exam today.  He will continue  on amlodipine 5 mg daily and valsartan-HCT 20-25 mg daily..  3.  Hyperlipidemia -his LDL goal is less than 70.  His last LDL was 143 on 06/03/2018.  I am going to change his pravastatin to Lipitor 80 mg daily.  I will recheck a FLP and ALT in 6 weeks.  4.  Ascending aortic aneurysm -it was noted on CT scan 46 mm.  Cardiac  CTA has been ordered to assess his coronary arteries further and this will be able to further assess his entire chest.  I will also get an abdominal ultrasound to rule out abdominal aortic aneurysm.  He needs aggressive blood pressure control and lipid control.  We are changing him to high-dose Lipitor.    Medication Adjustments/Labs and Tests Ordered: Current medicines are reviewed at length with the patient today.  Concerns regarding medicines are outlined above.  Medication changes, Labs and Tests ordered today are listed in the Patient Instructions below.  There are no Patient Instructions on file for this visit.   Signed, Fransico Him, MD  07/19/2018 9:11 AM    [facility] [place], [place], [place]  [address] Phone: [phone]; Fax: [phone]

## 2018-07-22 ENCOUNTER — Ambulatory Visit (HOSPITAL_COMMUNITY): Payer: Commercial Managed Care - PPO | Attending: Cardiovascular Disease

## 2018-07-22 DIAGNOSIS — R0602 Shortness of breath: Secondary | ICD-10-CM | POA: Insufficient documentation

## 2018-07-22 DIAGNOSIS — I517 Cardiomegaly: Secondary | ICD-10-CM | POA: Diagnosis not present

## 2018-07-22 DIAGNOSIS — I712 Thoracic aortic aneurysm, without rupture: Secondary | ICD-10-CM

## 2018-07-22 DIAGNOSIS — I7121 Aneurysm of the ascending aorta, without rupture: Secondary | ICD-10-CM

## 2018-07-23 ENCOUNTER — Telehealth: Payer: Self-pay

## 2018-07-23 DIAGNOSIS — I7121 Aneurysm of the ascending aorta, without rupture: Secondary | ICD-10-CM

## 2018-07-23 DIAGNOSIS — Z01810 Encounter for preprocedural cardiovascular examination: Secondary | ICD-10-CM

## 2018-07-23 DIAGNOSIS — I712 Thoracic aortic aneurysm, without rupture: Secondary | ICD-10-CM

## 2018-07-23 NOTE — Telephone Encounter (Signed)
-----   Message from Sueanne Margarita, MD sent at 07/22/2018  9:33 PM EST ----- Please let patient know that echo showed normal LVF with increased stiffness of heart muscle and moderately dilated ascending aorta.  This corresponds to CT findings of coronary calcium scoring.  Please get a dedicated Chest CTA and abdominal US to assess the descending thoracic aorta and abdominal aorta

## 2018-07-23 NOTE — Telephone Encounter (Signed)
Pt verbalized understanding of his Echo results and agrees to Dr. Theodosia Blender recommendation for an Abdominal US already scheduled for 07/25/2018 and will order chest CT. Pt agrees and will call if he develops any further questions.

## 2018-07-25 ENCOUNTER — Ambulatory Visit (HOSPITAL_COMMUNITY)
Admission: RE | Admit: 2018-07-25 | Discharge: 2018-07-25 | Disposition: A | Discharge status: Home / Self Care | Payer: Commercial Managed Care - PPO | Source: Ambulatory Visit | Attending: Cardiology | Admitting: Cardiology

## 2018-07-25 DIAGNOSIS — I712 Thoracic aortic aneurysm, without rupture: Secondary | ICD-10-CM | POA: Insufficient documentation

## 2018-07-25 DIAGNOSIS — I7121 Aneurysm of the ascending aorta, without rupture: Secondary | ICD-10-CM

## 2018-08-09 ENCOUNTER — Telehealth: Payer: Self-pay

## 2018-08-09 MED ORDER — METOPROLOL TARTRATE 100 MG PO TABS: ORAL_TABLET | ORAL | 0 refills | Status: DC

## 2018-08-09 NOTE — Telephone Encounter (Signed)
Spoke with the patient, he mistakenly took his metoprolol for the cardiac CT. He requested another dose be sent in.

## 2018-08-21 ENCOUNTER — Other Ambulatory Visit: Payer: Commercial Managed Care - PPO

## 2018-08-23 ENCOUNTER — Other Ambulatory Visit: Payer: Commercial Managed Care - PPO | Admitting: *Deleted

## 2018-08-23 ENCOUNTER — Telehealth (HOSPITAL_COMMUNITY): Payer: Self-pay | Admitting: Emergency Medicine

## 2018-08-23 DIAGNOSIS — Z01810 Encounter for preprocedural cardiovascular examination: Secondary | ICD-10-CM

## 2018-08-23 LAB — BASIC METABOLIC PANEL
BUN/Creatinine Ratio: 14 (ref 10–24)
BUN: 17 mg/dL (ref 8–27)
CO2: 25 mmol/L (ref 20–29)
Calcium: 10 mg/dL (ref 8.6–10.2)
Chloride: 99 mmol/L (ref 96–106)
Creatinine, Ser: 1.2 mg/dL (ref 0.76–1.27)
GFR calc Af Amer: 73 mL/min/{1.73_m2} (ref >59)
GFR calc non Af Amer: 64 mL/min/{1.73_m2} (ref >59)
GLUCOSE: 245 mg/dL — AB (ref 65–99)
Potassium: 3.8 mmol/L (ref 3.5–5.2)
Sodium: 140 mmol/L (ref 134–144)

## 2018-08-23 NOTE — Telephone Encounter (Signed)
Left message on voicemail with name and callback number Akera Snowberger RN Navigator Cardiac Imaging [facility] [phone] Office [phone] Cell  

## 2018-08-26 ENCOUNTER — Ambulatory Visit (HOSPITAL_COMMUNITY)
Admission: RE | Admit: 2018-08-26 | Discharge: 2018-08-26 | Disposition: A | Discharge status: Home / Self Care | Payer: Commercial Managed Care - PPO | Source: Ambulatory Visit | Attending: Cardiology | Admitting: Cardiology

## 2018-08-26 ENCOUNTER — Encounter (HOSPITAL_COMMUNITY): Payer: Self-pay

## 2018-08-26 ENCOUNTER — Other Ambulatory Visit: Payer: Commercial Managed Care - PPO

## 2018-08-26 DIAGNOSIS — I2510 Atherosclerotic heart disease of native coronary artery without angina pectoris: Secondary | ICD-10-CM

## 2018-08-26 DIAGNOSIS — I7121 Aneurysm of the ascending aorta, without rupture: Secondary | ICD-10-CM

## 2018-08-26 DIAGNOSIS — I712 Thoracic aortic aneurysm, without rupture: Secondary | ICD-10-CM | POA: Diagnosis not present

## 2018-08-26 DIAGNOSIS — R931 Abnormal findings on diagnostic imaging of heart and coronary circulation: Secondary | ICD-10-CM

## 2018-08-26 DIAGNOSIS — Z006 Encounter for examination for normal comparison and control in clinical research program: Secondary | ICD-10-CM

## 2018-08-26 MED ORDER — IOPAMIDOL (ISOVUE-370) INJECTION 76%
80.0000 mL | Freq: Once | INTRAVENOUS | Status: AC | PRN
  Administered 2018-08-26: 80 mL via INTRAVENOUS

## 2018-08-26 MED ORDER — NITROGLYCERIN 0.4 MG SL SUBL
0.8000 mg | SUBLINGUAL_TABLET | Freq: Once | SUBLINGUAL | Status: AC
  Administered 2018-08-26: 0.8 mg via SUBLINGUAL
  Filled 2018-08-26: qty 25

## 2018-08-26 MED ORDER — NITROGLYCERIN 0.4 MG SL SUBL
SUBLINGUAL_TABLET | SUBLINGUAL | Status: AC
  Filled 2018-08-26: qty 2

## 2018-08-26 NOTE — Research (Signed)
Jerry Cohen met inclusion and exclusion criteria.  The informed consent form, study requirements and expectations were reviewed with the subject and questions and concerns were addressed prior to the signing of the consent form.  The subject verbalized understanding of the trial requirements.  The subject agreed to participate in the [email or web address] trial and signed the informed consent.  The informed consent was obtained prior to performance of any protocol-specific procedures for the subject.  A copy of the signed informed consent was given to the subject and a copy was placed in the subject's medical record.   Dionne Bucy. [employer]

## 2018-08-28 ENCOUNTER — Telehealth: Payer: Self-pay

## 2018-08-28 DIAGNOSIS — I7121 Aneurysm of the ascending aorta, without rupture: Secondary | ICD-10-CM

## 2018-08-28 DIAGNOSIS — I712 Thoracic aortic aneurysm, without rupture: Secondary | ICD-10-CM

## 2018-08-28 NOTE — Telephone Encounter (Signed)
-----   Message from Sueanne Margarita, MD sent at 08/27/2018 10:44 AM EST ----- Please let patient know that noncardiac portion of Chest CT showed mild ascending thoracic aortic aneurysm at 4.1cm.  By echo this was 4.6cm and likely measured off axis on echo.  Please repeat Chest CTA in 1 year.

## 2018-08-28 NOTE — Telephone Encounter (Signed)
Spoke with the patient, he expressed understanding about his results. Set up for an OV tomorrow 08/29/18. Repeat CTA placed for 1 year. He had no further questions.

## 2018-08-29 ENCOUNTER — Telehealth: Payer: Self-pay

## 2018-08-29 ENCOUNTER — Ambulatory Visit (INDEPENDENT_AMBULATORY_CARE_PROVIDER_SITE_OTHER): Payer: Commercial Managed Care - PPO | Admitting: Cardiology

## 2018-08-29 ENCOUNTER — Encounter: Payer: Self-pay | Admitting: Cardiology

## 2018-08-29 VITALS — BP 104/68 | HR 75 | Ht 67.0 in | Wt 232.0 lb

## 2018-08-29 DIAGNOSIS — E785 Hyperlipidemia, unspecified: Secondary | ICD-10-CM

## 2018-08-29 DIAGNOSIS — I2510 Atherosclerotic heart disease of native coronary artery without angina pectoris: Secondary | ICD-10-CM | POA: Diagnosis not present

## 2018-08-29 DIAGNOSIS — I7121 Aneurysm of the ascending aorta, without rupture: Secondary | ICD-10-CM

## 2018-08-29 DIAGNOSIS — E1165 Type 2 diabetes mellitus with hyperglycemia: Secondary | ICD-10-CM | POA: Insufficient documentation

## 2018-08-29 DIAGNOSIS — I712 Thoracic aortic aneurysm, without rupture: Secondary | ICD-10-CM

## 2018-08-29 DIAGNOSIS — Z720 Tobacco use: Secondary | ICD-10-CM

## 2018-08-29 DIAGNOSIS — IMO0002 Reserved for concepts with insufficient information to code with codable children: Secondary | ICD-10-CM | POA: Insufficient documentation

## 2018-08-29 DIAGNOSIS — I10 Essential (primary) hypertension: Secondary | ICD-10-CM | POA: Diagnosis not present

## 2018-08-29 NOTE — Telephone Encounter (Signed)
Per Pecolia Ades, NP to scheduled patient to have a CT of the aorta in 6 months

## 2018-08-29 NOTE — H&P (View-Only) (Signed)
Cardiology Office Note:    Date:  08/29/2018   ID:  Jerry Cohen, DOB 02/10/1956, MRN [id number]  PCP:  Jerry Anger, MD  Cardiologist:  Fransico Him, MD  Referring MD: Jerry Anger, MD   Chief Complaint  Patient presents with  . Coronary Artery Disease    by CTA and FFR    History of Present Illness:    Jerry Cohen is a 63 y.o. male with a past medical history significant for obstructive sleep apnea on CPAP, HTN, HLD, tobacco use and GERD. Pt was seen recently for routine physical exam by his PCP and due to risk factors underwent cardiac CTA for calcium scoring. His calcium score was 284 which was at 80 percentile for age and sex matched controls. He was also noted to have a moderately dilated ascending aortic aneurysm at 46 mm.   He was seen by Dr. Radford Cohen for full evaluation on 07/19/2018. A cardiac CTA with FFR was done and showed severe stenosis of the mid Circumflex artery with cardiac cath recommended. Echocardiogram showed diastolic dysfunction and asymmetric LVH, EF not specified. It did show dilated ascending aorta at 45 mm.   Jerry Cohen is here today with his wife to arrange for cardiac cath. He denies any chest pain or shortness of breath. He works in [employer] but does not do a lot of cardio activity. He smokes 1 large cigar per day. He has no orthopnea or PND. Occ mild pedal edema, not at present.   Pt was not aware that he was diabetic but is on metformin and Prandin with A1c of 8.6 in 03/2018.   LDL was 143 in 05/2018- lipitor was increased 07/19/18.   Past Medical History:  Diagnosis Date  . Ascending aortic aneurysm ([place]) 07/19/2018  . Coronary artery calcification seen on CAT scan 07/19/2018  . Dizziness 2012  . ED (erectile dysfunction)   . GERD (gastroesophageal reflux disease)   . Hx of colonic polyps    Dr Fuller Plan  . Hyperlipidemia   . Hypertension   . OSA on CPAP   . Osteoarthritis     Past Surgical History:  Procedure Laterality  Date  . APPENDECTOMY  1996  . COLONOSCOPY  2015  . KNEE ARTHROSCOPY Left 2009  . LASIK Bilateral 2007  . SHOULDER ARTHROSCOPY W/ ROTATOR CUFF REPAIR Left 2010    Current Medications: Current Meds  Medication Sig  . amLODipine (NORVASC) 5 MG tablet Take 1 tablet (5 mg total) by mouth daily.  Marland Kitchen aspirin 81 MG EC tablet Take 81 mg by mouth daily.    Marland Kitchen atorvastatin (LIPITOR) 80 MG tablet Take 1 tablet (80 mg total) by mouth daily.  . Cholecalciferol (EQL VITAMIN D3) 25 MCG (1000 UT) tablet Take 2 tablets (2,000 Units total) by mouth daily.  . clotrimazole-betamethasone (LOTRISONE) cream Apply topically 2 (two) times daily.  Marland Kitchen esomeprazole (NEXIUM) 40 MG capsule TAKE 1 CAPSULE (40 MG TOTAL) BY MOUTH DAILY.INS ALLOWS 30 DAYS  . Icosapent Ethyl (VASCEPA) 1 g CAPS Take 2 capsules by mouth 2 (two) times daily.  . meloxicam (MOBIC) 15 MG tablet TAKE 1 TABLET (15 MG TOTAL) BY MOUTH DAILY AS NEEDED FOR PAIN (PC).  . metFORMIN (GLUCOPHAGE XR) 750 MG 24 hr tablet Take 2 tablets (1,500 mg total) by mouth daily with breakfast.  . predniSONE (DELTASONE) 10 MG tablet Prednisone 10 mg: take 4 tabs a day x 3 days; then 3 tabs a day x 4 days; then 2 tabs a  day x 4 days, then 1 tab a day x 6 days, then stop. Take pc.  . repaglinide (PRANDIN) 1 MG tablet Take 1 tablet (1 mg total) by mouth 3 (three) times daily before meals.  Marland Kitchen rOPINIRole (REQUIP) 1 MG tablet TAKE 1 TABLET BY MOUTH AT BEDTIME AS NEEDED FOR RESTLESS LEGS  . triamcinolone cream (KENALOG) 0.5 % APPLY TOPICALLY 3 (THREE) TIMES DAILY.  . valsartan-hydrochlorothiazide (DIOVAN-HCT) 320-25 MG tablet Take 1 tablet by mouth daily.  . [DISCONTINUED] metoprolol tartrate (LOPRESSOR) 100 MG tablet Take 1 tablet, 100 mg, 2 hours before your cardiac CT.     Allergies:   Atorvastatin   Social History   Socioeconomic History  . Marital status: Married    Spouse name: Not on file  . Number of children: Not on file  . Years of education: Not on file  .  Highest education level: Not on file  Occupational History  . Not on file  Social Needs  . Financial resource strain: Not on file  . Food insecurity:    Worry: Not on file    Inability: Not on file  . Transportation needs:    Medical: Not on file    Non-medical: Not on file  Tobacco Use  . Smoking status: Current Some Day Smoker    Types: Cigars  . Smokeless tobacco: Never Used  . Tobacco comment: quit smoking 30 years ago.  Substance and Sexual Activity  . Alcohol use: Yes    Alcohol/week: 0.0 standard drinks    Comment: about 2 times per month  . Drug use: No  . Sexual activity: Yes  Lifestyle  . Physical activity:    Days per week: Not on file    Minutes per session: Not on file  . Stress: Not on file  Relationships  . Social connections:    Talks on phone: Not on file    Gets together: Not on file    Attends religious service: Not on file    Active member of club or organization: Not on file    Attends meetings of clubs or organizations: Not on file    Relationship status: Not on file  Other Topics Concern  . Not on file  Social History Narrative  . Not on file     Family History: The patient's family history includes Cancer in his father, sister, and another family member; Colon cancer (age of onset: 71) in an other family member; Colon cancer (age of onset: 61) in his father and sister; Colon cancer (age of onset: 76) in his sister; Mental illness in his mother. There is no history of Coronary artery disease. ROS:   Please see the history of present illness.     All other systems reviewed and are negative.  EKGs/Labs/Other Studies Reviewed:    The following studies were reviewed today:  Cardiac CTA 08/27/2018 1. Left Main:  No significant stenosis. 2. LAD: No significant stenosis. 3. LCX: Proximal: 0.98, mid 0.78. 4. RCA: No significant stenosis.  IMPRESSION: 1. CT FFR analysis showed severe stenosis in the mid LCX artery. A cardiac catheterization is  recommended.  Cardiac CTA 08/26/2018 IMPRESSION: 1. Coronary calcium score of 268. This was 45 percentile for age and sex matched control. 2. Normal coronary origin with right dominance. Moderate CAD in the mid LCX artery, mid RCA and mild CAD in the proximal LAD. Additional analysis with CT FFR will be submitted. 3. There is moderately dilated ascending aortic aneurysm with maximum diameter 44 mm.  Annual chest CTA or MRA are recommended. 4.  Mildly dilated pulmonary artery measuring 32 mm.   Echocardiogram 07/22/18 IMPRESSIONS  1. The left ventricle appears to be normal in size, mild wall thickness. Echo evidence of impaired relaxation in diastolic filling patterns.  2. Asymmetric moderate left ventricular hypertrophy of the basal septum.  3. Grade 1 diastolic dysfunction (impaired relaxation).  4. The right ventricle is normal in size, has normal wall thickness and normal systolic function.  5. Mildly dilated left atrial size.  6. Normal right atrial size.  7. Ascending aorta dilatation (45 mm at the mid level).  8. No atrial level shunt detected by color flow Doppler.  9. The inferior vena cava was normal in size with greater than 50% respiratory variablity.  FINDINGS Left Ventricle: The left ventricle appears to be normal in size, have mild wall thickness, with unable to be compared to prior, no prior studies.. Echo evidence of impaired relaxation in diastolic filling patterns. Left ventricular septal wall thickness  was moderately increased. Asymmetric left ventricular hypertrophy of the basal and septal walls. Right Ventricle: The right ventricle is normal in size, has normal wall thickness and normal systolic function. Left Atrium: The left atrium is mildly dilated. Right Atrium: The right atrial size is normal in size. Interatrial Septum: No atrial level shunt detected by color flow Doppler. Increased thickness of the atrial septum sparing the fossa ovalis consistent with The  interatrial septum appears to be lipomatous.  Pericardium: There is no evidence of pericardial effusion. Mitral Valve: The mitral valve normal in structure and function. Mitral valve regurgitation is trivial by color flow Doppler. Tricuspid Valve: The tricuspid valve is normal in structure. Tricuspid regurgitation is trivial by color flow Doppler. Aortic Valve: The aortic valve has an indeterminant number of cusps. Pulmonic Valve: The pulmonic valve was not well visualized. The pulmonic valve is grossly normal. Pulmonic valve regurgitation is trivial by color flow Doppler. No evidence of pulmonic stenosis. Aorta: There is mild dilatation of the ascending aorta. Pulmonary Artery: The pulmonary artery is of normal size. Venous: The inferior vena cava was normal in size with greater than 50% respiratory variablity. Additional Findings: No aortic valve stenosis or regurgitation. RVSP not able to be accurately estimated.   EKG:  EKG is ordered today.  The ekg ordered today demonstrates NSR, 75 bpm, minimal voltage criteria for LVH, may be normal variant.   Recent Labs: 06/03/2018: ALT 97; Hemoglobin 15.8; Platelets 248.0; TSH 0.99 08/23/2018: BUN 17; Creatinine, Ser 1.20; Potassium 3.8; Sodium 140   Recent Lipid Panel    Component Value Date/Time   CHOL 212 (H) 06/03/2018 1008   TRIG 282.0 (H) 06/03/2018 1008   HDL 31.70 (L) 06/03/2018 1008   CHOLHDL 7 06/03/2018 1008   VLDL 56.4 (H) 06/03/2018 1008   LDLCALC 144 (H) 04/18/2018 1409   LDLDIRECT 143.0 06/03/2018 1008    Physical Exam:    VS:  BP 104/68   Pulse 75   Ht 5\' 7"  (1.702 m)   Wt 232 lb (105.2 kg)   SpO2 94%   BMI 36.34 kg/m     Wt Readings from Last 3 Encounters:  08/29/18 232 lb (105.2 kg)  07/19/18 238 lb 1.9 oz (108 kg)  06/03/18 232 lb (105.2 kg)     Physical Exam  Constitutional: He is oriented to person, place, and time. He appears well-developed and well-nourished. No distress.  HENT:  Head: Normocephalic  and atraumatic.  Neck: Normal range of motion. Neck supple.  No JVD present.  Cardiovascular: Normal rate, regular rhythm, normal heart sounds and intact distal pulses. Exam reveals no gallop and no friction rub.  No murmur heard. Pulmonary/Chest: Effort normal and breath sounds normal. No respiratory distress. He has no wheezes. He has no rales.  Abdominal: Soft. Bowel sounds are normal.  Musculoskeletal: Normal range of motion.        General: No deformity or edema.  Neurological: He is alert and oriented to person, place, and time.  Skin: Skin is warm and dry.  Psychiatric: He has a normal mood and affect. His behavior is normal. Judgment and thought content normal.  Vitals reviewed.  ASSESSMENT:    1. Coronary artery disease involving native coronary artery of native heart without angina pectoris   2. Ascending aortic aneurysm ([place])   3. Essential hypertension   4. Hyperlipidemia LDL goal <70   5. Uncontrolled type 2 diabetes mellitus with hyperglycemia ([place])   6. Tobacco abuse    PLAN:    In order of problems listed above:  CAD -Cardiac CTA showed elevated calcium score of 268 and moderate CAD of mid Circ, mid RCA and mild CAD of prox LAD. Mid Circ was found to have flow limiting stenosis by FFR of 0.78.  -Echo showed asymmetric LVH of the basal and septal walls.  -Will arrange for cardiac catheterization on Monday. -Will hold valsartan/HCTZ and metformin on am of procedure.  -Check labs today. -The patient understands that risks included but are not limited to stroke (1 in 1000), death (1 in 1000), kidney failure [usually temporary] (1 in 500), bleeding (1 in 200), allergic reaction [possibly serious] (1 in 200).  -Discussed risk modification with diet, exercise, lipid management, tobacco cessation and blood sugar control.   Ascending aortic aneurysm - 44 mm by CTA, 45 mm by echo. Aortic valve with indeterminant number of cusps.  -Recommendation for annual follow up but  since this is the initial identification, will check in 6 months for any change.   Hypertension -On valsartan-HCTZ 320-25 mg, amlodipine 5 mg.  -BP well controlled, a little low. 104/68 today. Will likely add BB after cath. May need to decrease amlodipine at that time.   Hyperlipidemia -On atorvastatin 80 mg daily -LDL was 143 in 05/2018- lipitor was increased 07/19/18.  Recheck lipid panel in late march. Goal LDL <70.  Diabetes type 2 -Pt was not aware that he was diabetic but is on metformin and Prandin with A1c of 8.6 in 03/2018.  -Discussed better blood sugar control to reduce future CV risk. Advised to see his PCP for med adjustment.  -Work on diet.   OSA -On CPAP, compliant.   Tobacco use -Pt smokes 1 large cigar daily. -We discussed effects of smoking on CAD. Advised on cessation.   Medication Adjustments/Labs and Tests Ordered: Current medicines are reviewed at length with the patient today.  Concerns regarding medicines are outlined above. Labs and tests ordered and medication changes are outlined in the patient instructions below:  Patient Instructions  Medication Instructions:  Your physician recommends that you continue on your current medications as directed. Please refer to the Current Medication list given to you today.  If you need a refill on your cardiac medications before your next appointment, please call your pharmacy.   Lab work: TODAY: BMET & CBC   Your physician recommends that you return for a FASTING lipid profile: in 3 weeks  If you have labs (blood work) drawn today and your tests are completely normal,  you will receive your results only by: Marland Kitchen MyChart Message (if you have MyChart) OR . A paper copy in the mail If you have any lab test that is abnormal or we need to change your treatment, we will call you to review the results.  Testing/Procedures:    [facility]  OFFICE [address] [address], [address] [place] [address] Dept: [phone] Loc: [place]  08/29/2018  You are scheduled for a Cardiac Catheterization on Monday, March 9 with Dr. Daneen Schick.  1. Please arrive at the [facility] (Main Entrance A) at [facility]: [address] [place], [place] [address] at 5:30 AM (This time is two hours before your procedure to ensure your preparation). Free valet parking service is available.   Special note: Every effort is made to have your procedure done on time. Please understand that emergencies sometimes delay scheduled procedures.  2. Diet: Do not eat solid foods after midnight.  The patient may have clear liquids until 5am upon the day of the procedure.  4. Medication instructions in preparation for your procedure:   HOLD Valsartan- hydrochlorothiazide the morning of your procedure    HOLD your repaglinide the morning of your procedure   HOLD your metformin the morning of your procedure and do not resume until 48 hours after your procedure   On the morning of your procedure, take your Aspirin and any morning medicines NOT listed above.  You may use sips of water.  5. Plan for one night stay--bring personal belongings. 6. Bring a current list of your medications and current insurance cards. 7. You MUST have a responsible person to drive you home. 8. Someone MUST be with you the first 24 hours after you arrive home or your discharge will be delayed. 9. Please wear clothes that are easy to get on and off and wear slip-on shoes.  Thank you for allowing [place] to care for you!   -- [place] [facility]   Follow-Up: At [facility], you and your health needs are our priority.  As part of our continuing mission to provide you with exceptional heart care, we have created designated Provider Care Teams.  These Care Teams include your primary Cardiologist (physician) and Advanced Practice  Providers (APPs -  Physician Assistants and Nurse Practitioners) who all work together to provide you with the care you need, when you need it. You will need a follow up appointment in 2 weeks. You may see Fransico Him, MD or one of the following Advanced Practice Providers on your designated Care Team:   [place], PA-C Melina Copa, PA-C . Ermalinda Barrios, PA-C  Any Other Special Instructions Will Be Listed Below (If Applicable).    Signed, Daune Perch, NP  08/29/2018 1:34 PM    [facility]

## 2018-08-29 NOTE — Patient Instructions (Addendum)
Medication Instructions:  Your physician recommends that you continue on your current medications as directed. Please refer to the Current Medication list given to you today.  If you need a refill on your cardiac medications before your next appointment, please call your pharmacy.   Lab work: TODAY: BMET & CBC   Your physician recommends that you return for a FASTING lipid profile: in 3 weeks  If you have labs (blood work) drawn today and your tests are completely normal, you will receive your results only by: Marland Kitchen MyChart Message (if you have MyChart) OR . A paper copy in the mail If you have any lab test that is abnormal or we need to change your treatment, we will call you to review the results.  Testing/Procedures:    [place] OFFICE [place], [address] [place] [address] Dept: [phone] Loc: [place]  08/29/2018  You are scheduled for a Cardiac Catheterization on Monday, March 9 with Dr. Daneen Schick.  1. Please arrive at the [facility] (Main Entrance A) at [facility]: [address] [place], [place] [address] at 5:30 AM (This time is two hours before your procedure to ensure your preparation). Free valet parking service is available.   Special note: Every effort is made to have your procedure done on time. Please understand that emergencies sometimes delay scheduled procedures.  2. Diet: Do not eat solid foods after midnight.  The patient may have clear liquids until 5am upon the day of the procedure.  4. Medication instructions in preparation for your procedure:   HOLD Valsartan- hydrochlorothiazide the morning of your procedure    HOLD your repaglinide the morning of your procedure   HOLD your metformin the morning of your procedure and do not resume until 48 hours after your procedure   On the morning of your procedure, take your Aspirin and any  morning medicines NOT listed above.  You may use sips of water.  5. Plan for one night stay--bring personal belongings. 6. Bring a current list of your medications and current insurance cards. 7. You MUST have a responsible person to drive you home. 8. Someone MUST be with you the first 24 hours after you arrive home or your discharge will be delayed. 9. Please wear clothes that are easy to get on and off and wear slip-on shoes.  Thank you for allowing [place] to care for you!   -- [place] [facility]   Follow-Up: At [facility], you and your health needs are our priority.  As part of our continuing mission to provide you with exceptional heart care, we have created designated Provider Care Teams.  These Care Teams include your primary Cardiologist (physician) and Advanced Practice Providers (APPs -  Physician Assistants and Nurse Practitioners) who all work together to provide you with the care you need, when you need it. You will need a follow up appointment in 2 weeks. You may see Fransico Him, MD or one of the following Advanced Practice Providers on your designated Care Team:   [place], PA-C Melina Copa, PA-C . Ermalinda Barrios, PA-C  Any Other Special Instructions Will Be Listed Below (If Applicable).

## 2018-08-29 NOTE — Progress Notes (Signed)
Cardiology Office Note:    Date:  08/29/2018   ID:  Jerry Cohen, DOB Oct 06, 1955, MRN [id number]  PCP:  Cassandria Anger, MD  Cardiologist:  Fransico Him, MD  Referring MD: Cassandria Anger, MD   Chief Complaint  Patient presents with  . Coronary Artery Disease    by CTA and FFR    History of Present Illness:    Jerry Cohen is a 63 y.o. male with a past medical history significant for obstructive sleep apnea on CPAP, HTN, HLD, tobacco use and GERD. Pt was seen recently for routine physical exam by his PCP and due to risk factors underwent cardiac CTA for calcium scoring. His calcium score was 284 which was at 51 percentile for age and sex matched controls. He was also noted to have a moderately dilated ascending aortic aneurysm at 46 mm.   He was seen by Jerry Cohen for full evaluation on 07/19/2018. A cardiac CTA with FFR was done and showed severe stenosis of the mid Circumflex artery with cardiac cath recommended. Echocardiogram showed diastolic dysfunction and asymmetric LVH, EF not specified. It did show dilated ascending aorta at 45 mm.   Jerry Cohen is here today with his wife to arrange for cardiac cath. He denies any chest pain or shortness of breath. He works in [employer] but does not do a lot of cardio activity. He smokes 1 large cigar per day. He has no orthopnea or PND. Occ mild pedal edema, not at present.   Pt was not aware that he was diabetic but is on metformin and Prandin with A1c of 8.6 in 03/2018.   LDL was 143 in 05/2018- lipitor was increased 07/19/18.   Past Medical History:  Diagnosis Date  . Ascending aortic aneurysm ([place]) 07/19/2018  . Coronary artery calcification seen on CAT scan 07/19/2018  . Dizziness 2012  . ED (erectile dysfunction)   . GERD (gastroesophageal reflux disease)   . Hx of colonic polyps    Jerry Cohen  . Hyperlipidemia   . Hypertension   . OSA on CPAP   . Osteoarthritis     Past Surgical History:  Procedure Laterality  Date  . APPENDECTOMY  1996  . COLONOSCOPY  2015  . KNEE ARTHROSCOPY Left 2009  . LASIK Bilateral 2007  . SHOULDER ARTHROSCOPY W/ ROTATOR CUFF REPAIR Left 2010    Current Medications: Current Meds  Medication Sig  . amLODipine (NORVASC) 5 MG tablet Take 1 tablet (5 mg total) by mouth daily.  Marland Kitchen aspirin 81 MG EC tablet Take 81 mg by mouth daily.    Marland Kitchen atorvastatin (LIPITOR) 80 MG tablet Take 1 tablet (80 mg total) by mouth daily.  . Cholecalciferol (EQL VITAMIN D3) 25 MCG (1000 UT) tablet Take 2 tablets (2,000 Units total) by mouth daily.  . clotrimazole-betamethasone (LOTRISONE) cream Apply topically 2 (two) times daily.  Marland Kitchen esomeprazole (NEXIUM) 40 MG capsule TAKE 1 CAPSULE (40 MG TOTAL) BY MOUTH DAILY.INS ALLOWS 30 DAYS  . Icosapent Ethyl (VASCEPA) 1 g CAPS Take 2 capsules by mouth 2 (two) times daily.  . meloxicam (MOBIC) 15 MG tablet TAKE 1 TABLET (15 MG TOTAL) BY MOUTH DAILY AS NEEDED FOR PAIN (PC).  . metFORMIN (GLUCOPHAGE XR) 750 MG 24 hr tablet Take 2 tablets (1,500 mg total) by mouth daily with breakfast.  . predniSONE (DELTASONE) 10 MG tablet Prednisone 10 mg: take 4 tabs a day x 3 days; then 3 tabs a day x 4 days; then 2 tabs a  day x 4 days, then 1 tab a day x 6 days, then stop. Take pc.  . repaglinide (PRANDIN) 1 MG tablet Take 1 tablet (1 mg total) by mouth 3 (three) times daily before meals.  Marland Kitchen rOPINIRole (REQUIP) 1 MG tablet TAKE 1 TABLET BY MOUTH AT BEDTIME AS NEEDED FOR RESTLESS LEGS  . triamcinolone cream (KENALOG) 0.5 % APPLY TOPICALLY 3 (THREE) TIMES DAILY.  . valsartan-hydrochlorothiazide (DIOVAN-HCT) 320-25 MG tablet Take 1 tablet by mouth daily.  . [DISCONTINUED] metoprolol tartrate (LOPRESSOR) 100 MG tablet Take 1 tablet, 100 mg, 2 hours before your cardiac CT.     Allergies:   Atorvastatin   Social History   Socioeconomic History  . Marital status: Married    Spouse name: Not on file  . Number of children: Not on file  . Years of education: Not on file  .  Highest education level: Not on file  Occupational History  . Not on file  Social Needs  . Financial resource strain: Not on file  . Food insecurity:    Worry: Not on file    Inability: Not on file  . Transportation needs:    Medical: Not on file    Non-medical: Not on file  Tobacco Use  . Smoking status: Current Some Day Smoker    Types: Cigars  . Smokeless tobacco: Never Used  . Tobacco comment: quit smoking 30 years ago.  Substance and Sexual Activity  . Alcohol use: Yes    Alcohol/week: 0.0 standard drinks    Comment: about 2 times per month  . Drug use: No  . Sexual activity: Yes  Lifestyle  . Physical activity:    Days per week: Not on file    Minutes per session: Not on file  . Stress: Not on file  Relationships  . Social connections:    Talks on phone: Not on file    Gets together: Not on file    Attends religious service: Not on file    Active member of club or organization: Not on file    Attends meetings of clubs or organizations: Not on file    Relationship status: Not on file  Other Topics Concern  . Not on file  Social History Narrative  . Not on file     Family History: The patient's family history includes Cancer in his father, sister, and another family member; Colon cancer (age of onset: 71) in an other family member; Colon cancer (age of onset: 24) in his father and sister; Colon cancer (age of onset: 39) in his sister; Mental illness in his mother. There is no history of Coronary artery disease. ROS:   Please see the history of present illness.     All other systems reviewed and are negative.  EKGs/Labs/Other Studies Reviewed:    The following studies were reviewed today:  Cardiac CTA 08/27/2018 1. Left Main:  No significant stenosis. 2. LAD: No significant stenosis. 3. LCX: Proximal: 0.98, mid 0.78. 4. RCA: No significant stenosis.  IMPRESSION: 1. CT FFR analysis showed severe stenosis in the mid LCX artery. A cardiac catheterization is  recommended.  Cardiac CTA 08/26/2018 IMPRESSION: 1. Coronary calcium score of 268. This was 41 percentile for age and sex matched control. 2. Normal coronary origin with right dominance. Moderate CAD in the mid LCX artery, mid RCA and mild CAD in the proximal LAD. Additional analysis with CT FFR will be submitted. 3. There is moderately dilated ascending aortic aneurysm with maximum diameter 44 mm.  Annual chest CTA or MRA are recommended. 4.  Mildly dilated pulmonary artery measuring 32 mm.   Echocardiogram 07/22/18 IMPRESSIONS  1. The left ventricle appears to be normal in size, mild wall thickness. Echo evidence of impaired relaxation in diastolic filling patterns.  2. Asymmetric moderate left ventricular hypertrophy of the basal septum.  3. Grade 1 diastolic dysfunction (impaired relaxation).  4. The right ventricle is normal in size, has normal wall thickness and normal systolic function.  5. Mildly dilated left atrial size.  6. Normal right atrial size.  7. Ascending aorta dilatation (45 mm at the mid level).  8. No atrial level shunt detected by color flow Doppler.  9. The inferior vena cava was normal in size with greater than 50% respiratory variablity.  FINDINGS Left Ventricle: The left ventricle appears to be normal in size, have mild wall thickness, with unable to be compared to prior, no prior studies.. Echo evidence of impaired relaxation in diastolic filling patterns. Left ventricular septal wall thickness  was moderately increased. Asymmetric left ventricular hypertrophy of the basal and septal walls. Right Ventricle: The right ventricle is normal in size, has normal wall thickness and normal systolic function. Left Atrium: The left atrium is mildly dilated. Right Atrium: The right atrial size is normal in size. Interatrial Septum: No atrial level shunt detected by color flow Doppler. Increased thickness of the atrial septum sparing the fossa ovalis consistent with The  interatrial septum appears to be lipomatous.  Pericardium: There is no evidence of pericardial effusion. Mitral Valve: The mitral valve normal in structure and function. Mitral valve regurgitation is trivial by color flow Doppler. Tricuspid Valve: The tricuspid valve is normal in structure. Tricuspid regurgitation is trivial by color flow Doppler. Aortic Valve: The aortic valve has an indeterminant number of cusps. Pulmonic Valve: The pulmonic valve was not well visualized. The pulmonic valve is grossly normal. Pulmonic valve regurgitation is trivial by color flow Doppler. No evidence of pulmonic stenosis. Aorta: There is mild dilatation of the ascending aorta. Pulmonary Artery: The pulmonary artery is of normal size. Venous: The inferior vena cava was normal in size with greater than 50% respiratory variablity. Additional Findings: No aortic valve stenosis or regurgitation. RVSP not able to be accurately estimated.   EKG:  EKG is ordered today.  The ekg ordered today demonstrates NSR, 75 bpm, minimal voltage criteria for LVH, may be normal variant.   Recent Labs: 06/03/2018: ALT 97; Hemoglobin 15.8; Platelets 248.0; TSH 0.99 08/23/2018: BUN 17; Creatinine, Ser 1.20; Potassium 3.8; Sodium 140   Recent Lipid Panel    Component Value Date/Time   CHOL 212 (H) 06/03/2018 1008   TRIG 282.0 (H) 06/03/2018 1008   HDL 31.70 (L) 06/03/2018 1008   CHOLHDL 7 06/03/2018 1008   VLDL 56.4 (H) 06/03/2018 1008   LDLCALC 144 (H) 04/18/2018 1409   LDLDIRECT 143.0 06/03/2018 1008    Physical Exam:    VS:  BP 104/68   Pulse 75   Ht 5\' 7"  (1.702 m)   Wt 232 lb (105.2 kg)   SpO2 94%   BMI 36.34 kg/m     Wt Readings from Last 3 Encounters:  08/29/18 232 lb (105.2 kg)  07/19/18 238 lb 1.9 oz (108 kg)  06/03/18 232 lb (105.2 kg)     Physical Exam  Constitutional: He is oriented to person, place, and time. He appears well-developed and well-nourished. No distress.  HENT:  Head: Normocephalic  and atraumatic.  Neck: Normal range of motion. Neck supple.  No JVD present.  Cardiovascular: Normal rate, regular rhythm, normal heart sounds and intact distal pulses. Exam reveals no gallop and no friction rub.  No murmur heard. Pulmonary/Chest: Effort normal and breath sounds normal. No respiratory distress. He has no wheezes. He has no rales.  Abdominal: Soft. Bowel sounds are normal.  Musculoskeletal: Normal range of motion.        General: No deformity or edema.  Neurological: He is alert and oriented to person, place, and time.  Skin: Skin is warm and dry.  Psychiatric: He has a normal mood and affect. His behavior is normal. Judgment and thought content normal.  Vitals reviewed.  ASSESSMENT:    1. Coronary artery disease involving native coronary artery of native heart without angina pectoris   2. Ascending aortic aneurysm ([place])   3. Essential hypertension   4. Hyperlipidemia LDL goal <70   5. Uncontrolled type 2 diabetes mellitus with hyperglycemia ([place])   6. Tobacco abuse    Cohen:    In order of problems listed above:  CAD -Cardiac CTA showed elevated calcium score of 268 and moderate CAD of mid Circ, mid RCA and mild CAD of prox LAD. Mid Circ was found to have flow limiting stenosis by FFR of 0.78.  -Echo showed asymmetric LVH of the basal and septal walls.  -Will arrange for cardiac catheterization on Monday. -Will hold valsartan/HCTZ and metformin on am of procedure.  -Check labs today. -The patient understands that risks included but are not limited to stroke (1 in 1000), death (1 in 1000), kidney failure [usually temporary] (1 in 500), bleeding (1 in 200), allergic reaction [possibly serious] (1 in 200).  -Discussed risk modification with diet, exercise, lipid management, tobacco cessation and blood sugar control.   Ascending aortic aneurysm - 44 mm by CTA, 45 mm by echo. Aortic valve with indeterminant number of cusps.  -Recommendation for annual follow up but  since this is the initial identification, will check in 6 months for any change.   Hypertension -On valsartan-HCTZ 320-25 mg, amlodipine 5 mg.  -BP well controlled, a little low. 104/68 today. Will likely add BB after cath. May need to decrease amlodipine at that time.   Hyperlipidemia -On atorvastatin 80 mg daily -LDL was 143 in 05/2018- lipitor was increased 07/19/18.  Recheck lipid panel in late march. Goal LDL <70.  Diabetes type 2 -Pt was not aware that he was diabetic but is on metformin and Prandin with A1c of 8.6 in 03/2018.  -Discussed better blood sugar control to reduce future CV risk. Advised to see his PCP for med adjustment.  -Work on diet.   OSA -On CPAP, compliant.   Tobacco use -Pt smokes 1 large cigar daily. -We discussed effects of smoking on CAD. Advised on cessation.   Medication Adjustments/Labs and Tests Ordered: Current medicines are reviewed at length with the patient today.  Concerns regarding medicines are outlined above. Labs and tests ordered and medication changes are outlined in the patient instructions below:  Patient Instructions  Medication Instructions:  Your physician recommends that you continue on your current medications as directed. Please refer to the Current Medication list given to you today.  If you need a refill on your cardiac medications before your next appointment, please call your pharmacy.   Lab work: TODAY: BMET & CBC   Your physician recommends that you return for a FASTING lipid profile: in 3 weeks  If you have labs (blood work) drawn today and your tests are completely normal,  you will receive your results only by: Marland Kitchen MyChart Message (if you have MyChart) OR . A paper copy in the mail If you have any lab test that is abnormal or we need to change your treatment, we will call you to review the results.  Testing/Procedures:    [facility]  OFFICE [place], [address] [place] [address] Dept: [phone] Loc: [place]  08/29/2018  You are scheduled for a Cardiac Catheterization on Monday, March 9 with Jerry. Daneen Schick.  1. Please arrive at the [facility] (Main Entrance A) at [facility]: [address] [place], [place] [address] at 5:30 AM (This time is two hours before your procedure to ensure your preparation). Free valet parking service is available.   Special note: Every effort is made to have your procedure done on time. Please understand that emergencies sometimes delay scheduled procedures.  2. Diet: Do not eat solid foods after midnight.  The patient may have clear liquids until 5am upon the day of the procedure.  4. Medication instructions in preparation for your procedure:   HOLD Valsartan- hydrochlorothiazide the morning of your procedure    HOLD your repaglinide the morning of your procedure   HOLD your metformin the morning of your procedure and do not resume until 48 hours after your procedure   On the morning of your procedure, take your Aspirin and any morning medicines NOT listed above.  You may use sips of water.  5. Cohen for one night stay--bring personal belongings. 6. Bring a current list of your medications and current insurance cards. 7. You MUST have a responsible person to drive you home. 8. Someone MUST be with you the first 24 hours after you arrive home or your discharge will be delayed. 9. Please wear clothes that are easy to get on and off and wear slip-on shoes.  Thank you for allowing [place] to care for you!   -- [facility]   Follow-Up: At [facility], you and your health needs are our priority.  As part of our continuing mission to provide you with exceptional heart care, we have created designated Provider Care Teams.  These Care Teams include your primary Cardiologist (physician) and Advanced Practice  Providers (APPs -  Physician Assistants and Nurse Practitioners) who all work together to provide you with the care you need, when you need it. You will need a follow up appointment in 2 weeks. You may see Fransico Him, MD or one of the following Advanced Practice Providers on your designated Care Team:   [place], PA-C Melina Copa, PA-C . Ermalinda Barrios, PA-C  Any Other Special Instructions Will Be Listed Below (If Applicable).    Signed, Daune Perch, NP  08/29/2018 1:34 PM    [facility]

## 2018-08-30 LAB — CBC
Hematocrit: 45.2 % (ref 37.5–51.0)
Hemoglobin: 15.4 g/dL (ref 13.0–17.7)
MCH: 31.1 pg (ref 26.6–33.0)
MCHC: 34.1 g/dL (ref 31.5–35.7)
MCV: 91 fL (ref 79–97)
Platelets: 288 10*3/uL (ref 150–450)
RBC: 4.95 x10E6/uL (ref 4.14–5.80)
RDW: 12.2 % (ref 11.6–15.4)
WBC: 11.5 10*3/uL — AB (ref 3.4–10.8)

## 2018-08-30 LAB — BASIC METABOLIC PANEL
BUN/Creatinine Ratio: 15 (ref 10–24)
BUN: 15 mg/dL (ref 8–27)
CO2: 24 mmol/L (ref 20–29)
Calcium: 10 mg/dL (ref 8.6–10.2)
Chloride: 99 mmol/L (ref 96–106)
Creatinine, Ser: 1.03 mg/dL (ref 0.76–1.27)
GFR calc Af Amer: 88 mL/min/{1.73_m2} (ref >59)
GFR calc non Af Amer: 76 mL/min/{1.73_m2} (ref >59)
Glucose: 294 mg/dL — ABNORMAL HIGH (ref 65–99)
Potassium: 3.6 mmol/L (ref 3.5–5.2)
Sodium: 140 mmol/L (ref 134–144)

## 2018-09-02 ENCOUNTER — Ambulatory Visit (HOSPITAL_COMMUNITY)
Admission: RE | Admit: 2018-09-02 | Discharge: 2018-09-02 | Disposition: A | Discharge status: Home / Self Care | Payer: Commercial Managed Care - PPO | Attending: Interventional Cardiology | Admitting: Interventional Cardiology

## 2018-09-02 ENCOUNTER — Other Ambulatory Visit: Payer: Self-pay

## 2018-09-02 ENCOUNTER — Encounter (HOSPITAL_COMMUNITY): Payer: Self-pay | Admitting: *Deleted

## 2018-09-02 ENCOUNTER — Encounter (HOSPITAL_COMMUNITY)
Admission: RE | Disposition: A | Discharge status: Home / Self Care | Payer: Self-pay | Source: Home / Self Care | Attending: Interventional Cardiology

## 2018-09-02 DIAGNOSIS — M1990 Unspecified osteoarthritis, unspecified site: Secondary | ICD-10-CM | POA: Insufficient documentation

## 2018-09-02 DIAGNOSIS — N529 Male erectile dysfunction, unspecified: Secondary | ICD-10-CM | POA: Diagnosis not present

## 2018-09-02 DIAGNOSIS — F17290 Nicotine dependence, other tobacco product, uncomplicated: Secondary | ICD-10-CM | POA: Insufficient documentation

## 2018-09-02 DIAGNOSIS — G4733 Obstructive sleep apnea (adult) (pediatric): Secondary | ICD-10-CM | POA: Insufficient documentation

## 2018-09-02 DIAGNOSIS — K219 Gastro-esophageal reflux disease without esophagitis: Secondary | ICD-10-CM | POA: Insufficient documentation

## 2018-09-02 DIAGNOSIS — Z888 Allergy status to other drugs, medicaments and biological substances status: Secondary | ICD-10-CM | POA: Diagnosis not present

## 2018-09-02 DIAGNOSIS — Z7982 Long term (current) use of aspirin: Secondary | ICD-10-CM | POA: Insufficient documentation

## 2018-09-02 DIAGNOSIS — I25119 Atherosclerotic heart disease of native coronary artery with unspecified angina pectoris: Secondary | ICD-10-CM | POA: Insufficient documentation

## 2018-09-02 DIAGNOSIS — I10 Essential (primary) hypertension: Secondary | ICD-10-CM | POA: Diagnosis not present

## 2018-09-02 DIAGNOSIS — E1165 Type 2 diabetes mellitus with hyperglycemia: Secondary | ICD-10-CM | POA: Insufficient documentation

## 2018-09-02 DIAGNOSIS — Z79899 Other long term (current) drug therapy: Secondary | ICD-10-CM | POA: Diagnosis not present

## 2018-09-02 DIAGNOSIS — I7121 Aneurysm of the ascending aorta, without rupture: Secondary | ICD-10-CM | POA: Diagnosis present

## 2018-09-02 DIAGNOSIS — I712 Thoracic aortic aneurysm, without rupture: Secondary | ICD-10-CM | POA: Diagnosis not present

## 2018-09-02 DIAGNOSIS — I2510 Atherosclerotic heart disease of native coronary artery without angina pectoris: Secondary | ICD-10-CM | POA: Diagnosis not present

## 2018-09-02 DIAGNOSIS — IMO0002 Reserved for concepts with insufficient information to code with codable children: Secondary | ICD-10-CM | POA: Diagnosis present

## 2018-09-02 DIAGNOSIS — Z7984 Long term (current) use of oral hypoglycemic drugs: Secondary | ICD-10-CM | POA: Diagnosis not present

## 2018-09-02 DIAGNOSIS — E785 Hyperlipidemia, unspecified: Secondary | ICD-10-CM | POA: Insufficient documentation

## 2018-09-02 DIAGNOSIS — I209 Angina pectoris, unspecified: Secondary | ICD-10-CM | POA: Diagnosis present

## 2018-09-02 HISTORY — PX: LEFT HEART CATH AND CORONARY ANGIOGRAPHY: CATH118249

## 2018-09-02 HISTORY — DX: Type 2 diabetes mellitus without complications: E11.9

## 2018-09-02 LAB — GLUCOSE, CAPILLARY
Glucose-Capillary: 201 mg/dL — ABNORMAL HIGH (ref 70–99)
Glucose-Capillary: 189 mg/dL — ABNORMAL HIGH (ref 70–99)

## 2018-09-02 SURGERY — LEFT HEART CATH AND CORONARY ANGIOGRAPHY
Anesthesia: LOCAL

## 2018-09-02 MED ORDER — ASPIRIN 81 MG PO CHEW: 81.0000 mg | CHEWABLE_TABLET | Freq: Every day | ORAL | Status: DC

## 2018-09-02 MED ORDER — FENTANYL CITRATE (PF) 100 MCG/2ML IJ SOLN
INTRAMUSCULAR | Status: AC
  Filled 2018-09-02: qty 2

## 2018-09-02 MED ORDER — SODIUM CHLORIDE 0.9% FLUSH: 3.0000 mL | INTRAVENOUS | Status: DC | PRN

## 2018-09-02 MED ORDER — SODIUM CHLORIDE 0.9 % IV SOLN: 250.0000 mL | INTRAVENOUS | Status: DC | PRN

## 2018-09-02 MED ORDER — MIDAZOLAM HCL 2 MG/2ML IJ SOLN
INTRAMUSCULAR | Status: DC | PRN
  Administered 2018-09-02: 1 mg via INTRAVENOUS

## 2018-09-02 MED ORDER — SODIUM CHLORIDE 0.9% FLUSH: 3.0000 mL | Freq: Two times a day (BID) | INTRAVENOUS | Status: DC

## 2018-09-02 MED ORDER — VERAPAMIL HCL 2.5 MG/ML IV SOLN
INTRAVENOUS | Status: AC
  Filled 2018-09-02: qty 2

## 2018-09-02 MED ORDER — MIDAZOLAM HCL 2 MG/2ML IJ SOLN
INTRAMUSCULAR | Status: AC
  Filled 2018-09-02: qty 2

## 2018-09-02 MED ORDER — VERAPAMIL HCL 2.5 MG/ML IV SOLN
INTRAVENOUS | Status: DC | PRN
  Administered 2018-09-02: 10 mL via INTRA_ARTERIAL

## 2018-09-02 MED ORDER — HEPARIN SODIUM (PORCINE) 1000 UNIT/ML IJ SOLN
INTRAMUSCULAR | Status: DC | PRN
  Administered 2018-09-02: 5000 [IU] via INTRAVENOUS

## 2018-09-02 MED ORDER — HEPARIN (PORCINE) IN NACL 1000-0.9 UT/500ML-% IV SOLN
INTRAVENOUS | Status: AC
  Filled 2018-09-02: qty 1000

## 2018-09-02 MED ORDER — OXYCODONE HCL 5 MG PO TABS: 5.0000 mg | ORAL_TABLET | ORAL | Status: DC | PRN

## 2018-09-02 MED ORDER — HEPARIN (PORCINE) IN NACL 1000-0.9 UT/500ML-% IV SOLN
INTRAVENOUS | Status: DC | PRN
  Administered 2018-09-02 (×2): 500 mL

## 2018-09-02 MED ORDER — LIDOCAINE HCL (PF) 1 % IJ SOLN
INTRAMUSCULAR | Status: AC
  Filled 2018-09-02: qty 30

## 2018-09-02 MED ORDER — SODIUM CHLORIDE 0.9 % IV SOLN: INTRAVENOUS | Status: DC

## 2018-09-02 MED ORDER — LIDOCAINE HCL (PF) 1 % IJ SOLN
INTRAMUSCULAR | Status: DC | PRN
  Administered 2018-09-02: 2 mL

## 2018-09-02 MED ORDER — IOHEXOL 350 MG/ML SOLN
INTRAVENOUS | Status: DC | PRN
  Administered 2018-09-02: 90 mL via INTRA_ARTERIAL

## 2018-09-02 MED ORDER — ACETAMINOPHEN 325 MG PO TABS: 650.0000 mg | ORAL_TABLET | ORAL | Status: DC | PRN

## 2018-09-02 MED ORDER — FENTANYL CITRATE (PF) 100 MCG/2ML IJ SOLN
INTRAMUSCULAR | Status: DC | PRN
  Administered 2018-09-02: 25 ug via INTRAVENOUS

## 2018-09-02 MED ORDER — ONDANSETRON HCL 4 MG/2ML IJ SOLN: 4.0000 mg | Freq: Four times a day (QID) | INTRAMUSCULAR | Status: DC | PRN

## 2018-09-02 MED ORDER — ASPIRIN 81 MG PO CHEW: 81.0000 mg | CHEWABLE_TABLET | ORAL | Status: DC

## 2018-09-02 MED ORDER — SODIUM CHLORIDE 0.9 % WEIGHT BASED INFUSION
3.0000 mL/kg/h | INTRAVENOUS | Status: AC
  Administered 2018-09-02: 3 mL/kg/h via INTRAVENOUS

## 2018-09-02 MED ORDER — SODIUM CHLORIDE 0.9 % WEIGHT BASED INFUSION: 1.0000 mL/kg/h | INTRAVENOUS | Status: DC

## 2018-09-02 SURGICAL SUPPLY — 11 items
CATH 5FR JL3.5 JR4 ANG PIG MP (CATHETERS) ×2 IMPLANT
DEVICE RAD COMP TR BAND LRG (VASCULAR PRODUCTS) ×2 IMPLANT
GLIDESHEATH SLEND A-KIT 6F 22G (SHEATH) ×2 IMPLANT
GLIDESHEATH SLEND SS 6F .021 (SHEATH) IMPLANT
GUIDEWIRE INQWIRE 1.5J.035X260 (WIRE) ×1 IMPLANT
INQWIRE 1.5J .035X260CM (WIRE) ×2
KIT HEART LEFT (KITS) ×2 IMPLANT
PACK CARDIAC CATHETERIZATION (CUSTOM PROCEDURE TRAY) ×2 IMPLANT
SHEATH PROBE COVER 6X72 (BAG) ×2 IMPLANT
TRANSDUCER W/STOPCOCK (MISCELLANEOUS) ×2 IMPLANT
TUBING CIL FLEX 10 FLL-RA (TUBING) ×2 IMPLANT

## 2018-09-02 NOTE — Interval H&P Note (Signed)
Cath Lab Visit (complete for each Cath Lab visit)  Clinical Evaluation Leading to the Procedure:   ACS: No.  Non-ACS:    Anginal Classification: No Symptoms  Anti-ischemic medical therapy: Minimal Therapy (1 class of medications)  Non-Invasive Test Results: Intermediate-risk stress test findings: cardiac mortality 1-3%/year  Prior CABG: No previous CABG      History and Physical Interval Note:  09/02/2018 7:42 AM  Jerry Cohen  has presented today for surgery, with the diagnosis of Coronary Artery Disease.  The various methods of treatment have been discussed with the patient and family. After consideration of risks, benefits and other options for treatment, the patient has consented to  Procedure(s): LEFT HEART CATH AND CORONARY ANGIOGRAPHY (N/A) as a surgical intervention.  The patient's history has been reviewed, patient examined, no change in status, stable for surgery.  I have reviewed the patient's chart and labs.  Questions were answered to the patient's satisfaction.     Belva Crome III

## 2018-09-02 NOTE — Progress Notes (Signed)
Up to bathroom to void tol well  

## 2018-09-02 NOTE — Progress Notes (Signed)
Arm splint applied to right arm.  

## 2018-09-02 NOTE — CV Procedure (Signed)
   Radial access using real-time vascular ultrasound.  95% left ventricular branch preceded by aneurysm.  Mid circumflex with 65 to 75% stenosis.  Normal left main, widely patent LAD.  Low normal LV function with EF 50%.  Recommend medical therapy based upon COURAGE and ISCHEMIA data.  Would recommend adding low-dose beta-blocker therapy.

## 2018-09-02 NOTE — Discharge Instructions (Signed)
Drink plenty of fluids  Keep arm at or above heart level  Radial Site Care  This sheet gives you information about how to care for yourself after your procedure. Your health care provider may also give you more specific instructions. If you have problems or questions, contact your health care provider. What can I expect after the procedure? After the procedure, it is common to have:  Bruising and tenderness at the catheter insertion area. Follow these instructions at home: Medicines  Take over-the-counter and prescription medicines only as told by your health care provider. Insertion site care  Follow instructions from your health care provider about how to take care of your insertion site. Make sure you: ? Wash your hands with soap and water before you change your bandage (dressing). If soap and water are not available, use hand sanitizer. ? Change your dressing as told by your health care provider. ? Leave stitches (sutures), skin glue, or adhesive strips in place. These skin closures may need to stay in place for 2 weeks or longer. If adhesive strip edges start to loosen and curl up, you may trim the loose edges. Do not remove adhesive strips completely unless your health care provider tells you to do that.  Check your insertion site every day for signs of infection. Check for: ? Redness, swelling, or pain. ? Fluid or blood. ? Pus or a bad smell. ? Warmth.  Do not take baths, swim, or use a hot tub until your health care provider approves.  You may shower 24-48 hours after the procedure, or as directed by your health care provider. ? Remove the dressing and gently wash the site with plain soap and water. ? Pat the area dry with a clean towel. ? Do not rub the site. That could cause bleeding.  Do not apply powder or lotion to the site. Activity   For 24 hours after the procedure, or as directed by your health care provider: ? Do not flex or bend the affected arm. ? Do not  push or pull heavy objects with the affected arm. ? Do not drive yourself home from the hospital or clinic. You may drive 24 hours after the procedure unless your health care provider tells you not to. ? Do not operate machinery or power tools.  Do not lift anything that is heavier than 10 lb (4.5 kg), or the limit that you are told, until your health care provider says that it is safe.  Ask your health care provider when it is okay to: ? Return to work or school. ? Resume usual physical activities or sports. ? Resume sexual activity. General instructions  If the catheter site starts to bleed, raise your arm and put firm pressure on the site. If the bleeding does not stop, get help right away. This is a medical emergency.  If you went home on the same day as your procedure, a responsible adult should be with you for the first 24 hours after you arrive home.  Keep all follow-up visits as told by your health care provider. This is important. Contact a health care provider if:  You have a fever.  You have redness, swelling, or yellow drainage around your insertion site. Get help right away if:  You have unusual pain at the radial site.  The catheter insertion area swells very fast.  The insertion area is bleeding, and the bleeding does not stop when you hold steady pressure on the area.  Your arm or hand  becomes pale, cool, tingly, or numb. These symptoms may represent a serious problem that is an emergency. Do not wait to see if the symptoms will go away. Get medical help right away. Call your local emergency services (911 in the [place] Do not drive yourself to the hospital. Summary  After the procedure, it is common to have bruising and tenderness at the site.  Follow instructions from your health care provider about how to take care of your radial site wound. Check the wound every day for signs of infection.  Do not lift anything that is heavier than 10 lb (4.5 kg), or the limit  that you are told, until your health care provider says that it is safe. This information is not intended to replace advice given to you by your health care provider. Make sure you discuss any questions you have with your health care provider. Document Released: 07/15/2010 Document Revised: 07/18/2017 Document Reviewed: 07/18/2017 Elsevier Interactive Patient Education  2019 [employer].

## 2018-09-10 ENCOUNTER — Telehealth: Payer: Self-pay | Admitting: Physician Assistant

## 2018-09-10 NOTE — Telephone Encounter (Signed)
I spoke with patient about upcoming appointment this Thursday with Melina Copa, PA-C.  Patient would like to be seen post hospital.  He has some allergy symptoms but no recent fever shortness of breath or viral symptoms.  He took off work Thursday and cannot reschedule so wants to keep his appointment at 11:00.

## 2018-09-12 ENCOUNTER — Other Ambulatory Visit: Payer: Self-pay

## 2018-09-12 ENCOUNTER — Telehealth: Payer: Self-pay

## 2018-09-12 ENCOUNTER — Encounter: Payer: Self-pay | Admitting: Cardiovascular Disease

## 2018-09-12 ENCOUNTER — Ambulatory Visit: Payer: Commercial Managed Care - PPO | Admitting: Cardiovascular Disease

## 2018-09-12 ENCOUNTER — Ambulatory Visit: Payer: Commercial Managed Care - PPO | Admitting: Physician Assistant

## 2018-09-12 VITALS — BP 108/62 | HR 84 | Ht 66.0 in | Wt 235.8 lb

## 2018-09-12 DIAGNOSIS — I7121 Aneurysm of the ascending aorta, without rupture: Secondary | ICD-10-CM

## 2018-09-12 DIAGNOSIS — I712 Thoracic aortic aneurysm, without rupture: Secondary | ICD-10-CM | POA: Diagnosis not present

## 2018-09-12 DIAGNOSIS — E785 Hyperlipidemia, unspecified: Secondary | ICD-10-CM

## 2018-09-12 DIAGNOSIS — I10 Essential (primary) hypertension: Secondary | ICD-10-CM | POA: Diagnosis not present

## 2018-09-12 DIAGNOSIS — I2510 Atherosclerotic heart disease of native coronary artery without angina pectoris: Secondary | ICD-10-CM | POA: Diagnosis not present

## 2018-09-12 MED ORDER — NITROGLYCERIN 0.4 MG SL SUBL: 0.4000 mg | SUBLINGUAL_TABLET | SUBLINGUAL | 3 refills | Status: AC | PRN

## 2018-09-12 MED ORDER — METOPROLOL SUCCINATE ER 50 MG PO TB24: 50.0000 mg | ORAL_TABLET | Freq: Every day | ORAL | 3 refills | Status: DC

## 2018-09-12 MED ORDER — METOPROLOL SUCCINATE ER 25 MG PO TB24: 25.0000 mg | ORAL_TABLET | Freq: Every day | ORAL | 3 refills | Status: DC

## 2018-09-12 NOTE — Telephone Encounter (Signed)
-----   Message from Sueanne Margarita, MD sent at 09/09/2018 12:11 AM EDT ----- Thanks Hank.  Ben,    Please have patient stop amlodipine and start Toprol XL 25mg  daily.  His BP has been too soft to add Toprol on in addition to amlodipine.  Please have him see extender in 3-4 weeks  Traci ----- Message ----- From: Belva Crome, MD Sent: 09/02/2018   3:14 PM EDT To: Sueanne Margarita, MD, Cassandria Anger, MD  Mr. Ambrosio has moderately severe mid circumflex and high-grade proximal left ventricular branch.  This patient has stable ischemic heart disease and based on ISCHEMIA and COURAGE, medical therapy is appropriate.  He does have very mild exertional angina which he felt was "indigestion".  The indigestion is a burning sensation with activity that is relieved with rest.  HS

## 2018-09-12 NOTE — Patient Instructions (Addendum)
Medication Instructions:  Your physician has recommended you make the following change in your medication:  1- Take Take 1 Nitroglycerin (NTG), under your tongue, while sitting. If no relief of pain may repeat NTG, one tab every 5 minutes up to 3 tablets total over 15 minutes. If no relief CALL 911. If you have dizziness/lightheadness while taking NTG, stop taking and call 911. 2-Increase Metoprolol 50 mg by mouth daily.   If you need a refill on your cardiac medications before your next appointment, please call your pharmacy.   Lab work: Your physician recommends that you return for lab work on 09/19/18  If you have labs (blood work) drawn today and your tests are completely normal, you will receive your results only by: Marland Kitchen MyChart Message (if you have MyChart) OR . A paper copy in the mail If you have any lab test that is abnormal or we need to change your treatment, we will call you to review the results.  Testing/Procedures: None ordered today.   Follow-Up: At [facility], you and your health needs are our priority.  As part of our continuing mission to provide you with exceptional heart care, we have created designated Provider Care Teams.  These Care Teams include your primary Cardiologist (physician) and Advanced Practice Providers (APPs -  Physician Assistants and Nurse Practitioners) who all work together to provide you with the care you need, when you need it. You will need a follow up appointment in 3 months.  You may see Fransico Him, MD or one of the following Advanced Practice Providers on your designated Care Team:   Truitt Merle, NP Cecilie Kicks, NP . Kathyrn Drown, NP

## 2018-09-12 NOTE — Telephone Encounter (Signed)
Spoke with the patient, he accepted having his amlodipine stopped and he will start Toprol 25 mg, daily, by mouth. He agreed to being seen by provider in 1 month, depending on COVID-19. He had no further questions.

## 2018-09-12 NOTE — Progress Notes (Signed)
Cardiology Office Note    Date:  09/12/2018   ID:  Jerry Cohen, DOB 09-10-1955, MRN [id number]  PCP:  Cassandria Anger, MD  Cardiologist:  Jenkins Rouge, MD   No chief complaint on file.   History of Present Illness:  Jerry Cohen is a 63 y.o. male patient of Dr Radford Pax Seen for her today   Hhistory of obstructive sleep apnea on CPAP, hypertension, hyperlipidemia, poorly controlled DM and GERD.  Had screening calcium score ordered by primary and was  elevated at 284 which was 45 percentile for age and sex matched controls.  He was also noted to have a moderately dilated a sending aortic aneurysm at 46 mm.    He denies any chest pain or pressure,  PND, orthopnea, LE edema, dizziness, palpitations or syncope.  He has problems with chronic shortness of breath.  He is compliant with his meds and is tolerating meds with no SE.    Dr Radford Pax ordered a cardiac CTA and echo both studies reviewed  CT- aorta measured 4.4 cm gated Had stenosis in mid circumflex with FFR CT 0.78   Cath 09/02/18 with mid circumflex 60-70% and PDA 90% Dr Tamala Julian felt medical Rx warranted based on data from [facility] and [place]  Trials and relatively small territories of ischemia involved   Patient has some indigestion but no frank chest pain His indigestion is clearly helped by ant acids and stomach meds   Past Medical History:  Diagnosis Date  . Ascending aortic aneurysm ([place]) 07/19/2018  . Coronary artery calcification seen on CAT scan 07/19/2018  . Diabetes mellitus without complication ([place])   . Dizziness 2012  . ED (erectile dysfunction)   . GERD (gastroesophageal reflux disease)   . Hx of colonic polyps    Dr Fuller Plan  . Hyperlipidemia   . Hypertension   . OSA on CPAP   . Osteoarthritis     Past Surgical History:  Procedure Laterality Date  . APPENDECTOMY  1996  . COLONOSCOPY  2015  . KNEE ARTHROSCOPY Left 2009  . LASIK Bilateral 2007  . LEFT HEART CATH AND CORONARY ANGIOGRAPHY N/A 09/02/2018    Procedure: LEFT HEART CATH AND CORONARY ANGIOGRAPHY;  Surgeon: Belva Crome, MD;  Location: [facility];  Service: Cardiovascular;  Laterality: N/A;  . SHOULDER ARTHROSCOPY W/ ROTATOR CUFF REPAIR Left 2010    Current Medications: Current Meds  Medication Sig  . aspirin 81 MG EC tablet Take 81 mg by mouth daily.    Marland Kitchen atorvastatin (LIPITOR) 80 MG tablet Take 1 tablet (80 mg total) by mouth daily. (Patient taking differently: Take 80 mg by mouth every evening. )  . Cholecalciferol (EQL VITAMIN D3) 25 MCG (1000 UT) tablet Take 2 tablets (2,000 Units total) by mouth daily.  . Cyanocobalamin (VITAMIN B-12 PO) Take 1 tablet by mouth daily.  Marland Kitchen esomeprazole (NEXIUM) 40 MG capsule TAKE 1 CAPSULE (40 MG TOTAL) BY MOUTH DAILY.INS ALLOWS 30 DAYS (Patient taking differently: Take 40 mg by mouth daily. )  . metFORMIN (GLUCOPHAGE XR) 750 MG 24 hr tablet Take 2 tablets (1,500 mg total) by mouth daily with breakfast.  . metoprolol succinate (TOPROL-XL) 25 MG 24 hr tablet Take 1 tablet (25 mg total) by mouth daily.  . Multiple Vitamin (MULTIVITAMIN WITH MINERALS) TABS tablet Take 1 tablet by mouth daily. Ultra Vital Gold  . predniSONE (DELTASONE) 10 MG tablet Prednisone 10 mg: take 4 tabs a day x 3 days; then 3 tabs a day  x 4 days; then 2 tabs a day x 4 days, then 1 tab a day x 6 days, then stop. Take pc.  . repaglinide (PRANDIN) 1 MG tablet Take 1 tablet (1 mg total) by mouth 3 (three) times daily before meals.  Marland Kitchen rOPINIRole (REQUIP) 1 MG tablet TAKE 1 TABLET BY MOUTH AT BEDTIME AS NEEDED FOR RESTLESS LEGS (Patient taking differently: Take 1 mg by mouth at bedtime. )  . Specialty Vitamins Products (HEALTHY HEART PO) Take 1 tablet by mouth 2 (two) times daily. CV Complete Heart Health Formula  . valsartan-hydrochlorothiazide (DIOVAN-HCT) 320-25 MG tablet Take 1 tablet by mouth daily.    Allergies:   Patient has no known allergies.   Social History   Socioeconomic History  . Marital status: Married     Spouse name: Not on file  . Number of children: Not on file  . Years of education: Not on file  . Highest education level: Not on file  Occupational History  . Not on file  Social Needs  . Financial resource strain: Not on file  . Food insecurity:    Worry: Not on file    Inability: Not on file  . Transportation needs:    Medical: Not on file    Non-medical: Not on file  Tobacco Use  . Smoking status: Current Some Day Smoker    Types: Cigars  . Smokeless tobacco: Never Used  . Tobacco comment: quit smoking 30 years ago.  Substance and Sexual Activity  . Alcohol use: Yes    Alcohol/week: 0.0 standard drinks    Comment: about 2 times per month  . Drug use: No  . Sexual activity: Yes  Lifestyle  . Physical activity:    Days per week: Not on file    Minutes per session: Not on file  . Stress: Not on file  Relationships  . Social connections:    Talks on phone: Not on file    Gets together: Not on file    Attends religious service: Not on file    Active member of club or organization: Not on file    Attends meetings of clubs or organizations: Not on file    Relationship status: Not on file  Other Topics Concern  . Not on file  Social History Narrative  . Not on file     Family History:  The patient's family history includes Cancer in his father, sister, and another family member; Colon cancer (age of onset: 4) in an other family member; Colon cancer (age of onset: 27) in his father and sister; Colon cancer (age of onset: 70) in his sister; Mental illness in his mother.   ROS:   Please see the history of present illness.    ROS All other systems reviewed and are negative.  No flowsheet data found.   PHYSICAL EXAM:   VS:  BP 108/62   Pulse 84   Ht 5\' 6"  (1.676 m)   Wt 107 kg   SpO2 95%   BMI 38.06 kg/m    Affect appropriate Healthy:  appears stated age 44: normal Neck supple with no adenopathy JVP normal no bruits no thyromegaly Lungs clear with no  wheezing and good diaphragmatic motion Heart:  S1/S2 no murmur, no rub, gallop or click PMI normal Abdomen: benighn, BS positve, no tenderness, no AAA no bruit.  No HSM or HJR Distal pulses intact with no bruits No edema Neuro non-focal Skin warm and dry No muscular weakness   Wt  Readings from Last 3 Encounters:  09/12/18 107 kg  09/02/18 104.3 kg  08/29/18 105.2 kg      Studies/Labs Reviewed:   EKG:  EKG is not ordered today.    Recent Labs: 06/03/2018: ALT 97; TSH 0.99 08/29/2018: BUN 15; Creatinine, Ser 1.03; Hemoglobin 15.4; Platelets 288; Potassium 3.6; Sodium 140   Lipid Panel    Component Value Date/Time   CHOL 212 (H) 06/03/2018 1008   TRIG 282.0 (H) 06/03/2018 1008   HDL 31.70 (L) 06/03/2018 1008   CHOLHDL 7 06/03/2018 1008   VLDL 56.4 (H) 06/03/2018 1008   LDLCALC 144 (H) 04/18/2018 1409   LDLDIRECT 143.0 06/03/2018 1008    Additional studies/ records that were reviewed today include:  Office notes from PCP and Chest CT for calcium score    ASSESSMENT:    No diagnosis found.   PLAN:  In order of problems listed above:  1.  CAD   -mainly involving mid circumflex and PDA Medical Rx per HS after cath 09/02/18  Increase Toprol to 50 mg daily given high HR SL nitro called in   2.  Hypertension -he is well controlled on exam today.  He will continue on amlodipine 5 mg daily and valsartan-HCT 20-25 mg daily..  3.  Hyperlipidemia -his LDL goal is less than 70.  His last LDL was 143 on 06/03/2018.  Changed to high dose statin needs Repeat labs ordered   4.  Ascending aortic aneurysm -4.4 cm on gated CT f/u CT March 2021   5. DM:  Poorly controlled last A1c 04/18/18 8.6 discussed importance of tighter control F/U primary to discuss possibly Using Jardiance to Rx diabetes    Medication Adjustments/Labs and Tests Ordered: Current medicines are reviewed at length with the patient today.  Concerns regarding medicines are outlined above.  Medication changes,  Labs and Tests ordered today are listed in the Patient Instructions below.  There are no Patient Instructions on file for this visit.   Signed, Jenkins Rouge, MD  09/12/2018 11:04 AM    [facility] [place], [place], [place]  [address] Phone: [phone]; Fax: [phone]

## 2018-09-19 ENCOUNTER — Other Ambulatory Visit: Payer: Commercial Managed Care - PPO

## 2018-09-20 ENCOUNTER — Other Ambulatory Visit: Payer: Commercial Managed Care - PPO | Admitting: *Deleted

## 2018-09-20 ENCOUNTER — Other Ambulatory Visit: Payer: Self-pay

## 2018-09-20 DIAGNOSIS — E785 Hyperlipidemia, unspecified: Secondary | ICD-10-CM | POA: Diagnosis not present

## 2018-09-20 LAB — LIPID PANEL
Chol/HDL Ratio: 4.8 ratio (ref 0.0–5.0)
Cholesterol, Total: 129 mg/dL (ref 100–199)
HDL: 27 mg/dL — AB (ref >39)
LDL Calculated: 64 mg/dL (ref 0–99)
TRIGLYCERIDES: 189 mg/dL — AB (ref 0–149)
VLDL Cholesterol Cal: 38 mg/dL (ref 5–40)

## 2018-10-01 ENCOUNTER — Telehealth: Payer: Self-pay | Admitting: Nurse Practitioner

## 2018-10-01 NOTE — Telephone Encounter (Signed)
Phone call made today to discuss his upcoming OV with me for Tuesday, April 14th and to consider telehealth visit.    Patient was last seen March 19th post cath by Dr. Johnsie Cancel. He was advised to follow up with our office in 3 months (June).   Patient identifies Dr. Radford Pax as his cardiologist.   He is doing well and has no complaint - did not wish to be seen next week - nor felt to be needed. Will arrange his follow up as originally planned with Dr. Radford Pax. He will call for any issues in the interim. Recall placed in system.   Burtis Junes, RN, [place] [address] [place], [place]  [address] [phone]

## 2018-10-08 ENCOUNTER — Ambulatory Visit: Payer: Commercial Managed Care - PPO | Admitting: Nurse Practitioner

## 2018-11-11 ENCOUNTER — Other Ambulatory Visit: Payer: Self-pay | Admitting: Internal Medicine

## 2018-11-14 ENCOUNTER — Other Ambulatory Visit: Payer: Commercial Managed Care - PPO

## 2018-12-16 ENCOUNTER — Ambulatory Visit: Payer: Commercial Managed Care - PPO | Admitting: Nurse Practitioner

## 2019-01-10 ENCOUNTER — Telehealth: Payer: Self-pay

## 2019-01-10 NOTE — Telephone Encounter (Signed)
metFORMIN (GLUCOPHAGE-XR) 750 MG recalled, please advise about alternative.

## 2019-01-12 MED ORDER — METFORMIN HCL 500 MG PO TABS: 500.0000 mg | ORAL_TABLET | Freq: Three times a day (TID) | ORAL | 3 refills | Status: AC

## 2019-01-12 NOTE — Telephone Encounter (Signed)
I emailed a prescription for 500 mg metformin 1 tablet 3 times a day instead.  Thanks

## 2019-01-13 ENCOUNTER — Other Ambulatory Visit: Payer: Self-pay | Admitting: Neurosurgery

## 2019-01-13 NOTE — Telephone Encounter (Signed)
Pt informed of below.  

## 2019-01-14 ENCOUNTER — Telehealth: Payer: Self-pay | Admitting: *Deleted

## 2019-01-14 NOTE — Telephone Encounter (Signed)
Dr. Radford Pax  Can you please comment on pt's ASA therapy? He has a hx of CAD with last cath being 08/2018. Cath report below.    Codominant coronary anatomy.  Normal left main.  Luminal irregularities in the mid LAD.  40% ostial first diagonal.  70% tiny second diagonal.  Circumflex gives origin to 3 obtuse marginal branches.  The mid circumflex before the large second obtuse marginal contains eccentric 60 to 70% stenosis.  RCA is codominant.  The proximal PDA contains 90% stenosis, preceded by a fusiform aneurysm.  Overall normal LV function with EF 50%.  LVEDP is normal.   He has an upcoming cervical spine surgery 01/2019.   Please forward your response back to the pre-op pool.   Thank you  Sharee Pimple

## 2019-01-14 NOTE — Telephone Encounter (Signed)
Ok to hold ASA for surgery 

## 2019-01-14 NOTE — Telephone Encounter (Signed)
   [facility] Pre-operative Risk Assessment    Request for surgical clearance:  1. What type of surgery is being performed? ANTERIOR CERVICAL FUSION  2. When is this surgery scheduled? 01/27/19  3. What type of clearance is required (medical clearance vs. Pharmacy clearance to hold med vs. Both)? MEDICAL  4. Are there any medications that need to be held prior to surgery and how long? ASA  5. Practice name and name of physician performing surgery? [facility]; DR. JEFFREY JENKINS  6. What is your office phone number [phone]   7.   What is your office fax number [phone]  8.   Anesthesia type (None, local, MAC, general) ? GENERAL   Jerry Cohen 01/14/2019, 2:44 PM  _________________________________________________________________   (provider comments below)

## 2019-01-15 ENCOUNTER — Other Ambulatory Visit: Payer: Self-pay | Admitting: Neurosurgery

## 2019-01-15 NOTE — Telephone Encounter (Signed)
LMTCB 01/15/2019 at 0955am

## 2019-01-15 NOTE — Telephone Encounter (Signed)
   Primary Cardiologist: Jerry Him, MD  Chart reviewed as part of pre-operative protocol coverage. Patient was contacted 01/15/2019 in reference to pre-operative risk assessment for pending surgery as outlined below.  Jerry Cohen was last seen on 09/12/2018 by Dr. Johnsie Cancel.  Since that day, Jerry Cohen has done well from a cardiac perspective. He has no complaints while speaking with Cohen today  Per Dr. Radford Pax, it is acceptable to hold ASA as needed for procedure then resume as soon as possible per surgical team.  Therefore, based on ACC/AHA guidelines, the patient would be at acceptable risk for the planned procedure without further cardiovascular testing.   I will route this recommendation to the requesting party via Epic fax function and remove from pre-op pool.  Please call with questions.  Kathyrn Drown, NP 01/15/2019, 12:37 PM

## 2019-01-24 NOTE — Pre-Procedure Instructions (Signed)
[id number] - [place], [place] - [address] [place] [address] Phone: [phone] Fax: [phone]  [place], [place] [address] [place] [address] Phone: [phone] Fax: [phone]      Your procedure is scheduled on  01-30-19 Thursday  Report to [facility] Main Entrance "A" at 1030 A.M., and check in at the Admitting office.  Call this number if you have problems the morning of surgery:  [phone]  Call [phone] if you have any questions prior to your surgery date Monday-Friday 8am-4pm    Remember:  Do not eat or drink after midnight the night before your surgery  Take these medicines the morning of surgery with A SIP OF WATER: metoprolol succinate (TOPROL-XL)  esomeprazole (NEXIUM) nitroGLYCERIN (NITROSTAT)as needed Follow your surgeon's instructions on when to stop Aspirin.  If no instructions were given by your surgeon then you will need to call the office to get those instructions.    7 days prior to surgery STOP taking any Aspirin (unless otherwise instructed by your surgeon), Aleve, Naproxen, Ibuprofen, Motrin, Advil, Goody's, BC's, all herbal medications, fish oil, and all vitamins.   WHAT DO I DO ABOUT MY DIABETES MEDICATION?   Marland Kitchen Do not take oral diabetes medicines (pills)including METFORMIN and Repaglinide( PRANDIN)  the morning of surgery.   How to Manage Your Diabetes Before and After Surgery  Why is it important to control my blood sugar before and after surgery? . Improving blood sugar levels before and after surgery helps healing and can limit problems. . A way of improving blood sugar control is eating a healthy diet by: o  Eating less sugar and carbohydrates o  Increasing activity/exercise o  Talking with your doctor about reaching your blood sugar goals . High blood sugars (greater than 180 mg/dL) can raise your risk of  infections and slow your recovery, so you will need to focus on controlling your diabetes during the weeks before surgery. . Make sure that the doctor who takes care of your diabetes knows about your planned surgery including the date and location.  How do I manage my blood sugar before surgery? . Check your blood sugar at least 4 times a day, starting 2 days before surgery, to make sure that the level is not too high or low. o Check your blood sugar the morning of your surgery when you wake up and every 2 hours until you get to the Short Stay unit. . If your blood sugar is less than 70 mg/dL, you will need to treat for low blood sugar: o Do not take insulin. o Treat a low blood sugar (less than 70 mg/dL) with  cup of clear juice (cranberry or apple), 4 glucose tablets, OR glucose gel. o Recheck blood sugar in 15 minutes after treatment (to make sure it is greater than 70 mg/dL). If your blood sugar is not greater than 70 mg/dL on recheck, call [phone] for further instructions. . Report your blood sugar to the short stay nurse when you get to Short Stay.  . If you are admitted to the hospital after surgery: o Your blood sugar will be checked by the staff and you will probably be given insulin after surgery (instead of oral diabetes medicines) to make sure you have good blood sugar levels. o The goal for blood sugar control after surgery is 80-180 mg/dL.  The Morning of  Surgery  Do not wear jewelry.  Do not wear lotions or colognes, or deodorant             Men may shave face and neck.  Do not bring valuables to the hospital.  [facility] is not responsible for any belongings or valuables.  If you are a smoker, DO NOT Smoke 24 hours prior to surgery IF you wear a CPAP at night please bring your mask, tubing, and machine the morning of surgery   Remember that you must have someone to transport you home after your surgery, and remain with you for 24 hours if you are discharged the same  day.  Contacts, glasses, hearing aids, dentures or bridgework may not be worn into surgery.   Leave your suitcase in the car.  After surgery it may be brought to your room.  For patients admitted to the hospital, discharge time will be determined by your treatment team.  Patients discharged the day of surgery will not be allowed to drive home.    Special instructions:   [place]- Preparing For Surgery  Before surgery, you can play an important role. Because skin is not sterile, your skin needs to be as free of germs as possible. You can reduce the number of germs on your skin by washing with CHG (chlorahexidine gluconate) Soap before surgery.  CHG is an antiseptic cleaner which kills germs and bonds with the skin to continue killing germs even after washing.    Oral Hygiene is also important to reduce your risk of infection.  Remember - BRUSH YOUR TEETH THE MORNING OF SURGERY WITH YOUR REGULAR TOOTHPASTE  Please do not use if you have an allergy to CHG or antibacterial soaps. If your skin becomes reddened/irritated stop using the CHG.  Do not shave (including legs and underarms) for at least 48 hours prior to first CHG shower. It is OK to shave your face.  Please follow these instructions carefully.   1. Shower the NIGHT BEFORE SURGERY and the MORNING OF SURGERY with CHG Soap.   2. If you chose to wash your hair, wash your hair first as usual with your normal shampoo.  3. After you shampoo, rinse your hair and body thoroughly to remove the shampoo.  4. Use CHG as you would any other liquid soap. You can apply CHG directly to the skin and wash gently with a scrungie or a clean washcloth.   5. Apply the CHG Soap to your body ONLY FROM THE NECK DOWN.  Do not use on open wounds or open sores. Avoid contact with your eyes, ears, mouth and genitals (private parts). Wash Face and genitals (private parts)  with your normal soap.   6. Wash thoroughly, paying special attention to the area  where your surgery will be performed.  7. Thoroughly rinse your body with warm water from the neck down.  8. DO NOT shower/wash with your normal soap after using and rinsing off the CHG Soap.  9. Pat yourself dry with a CLEAN TOWEL.  10. Wear CLEAN PAJAMAS to bed the night before surgery, wear comfortable clothes the morning of surgery  11. Place CLEAN SHEETS on your bed the night of your first shower and DO NOT SLEEP WITH PETS.  Day of Surgery:  Do not apply any deodorants/lotions. Please shower the morning of surgery with the CHG soap  Please wear clean clothes to the hospital/surgery center.   Remember to brush your teeth WITH YOUR REGULAR TOOTHPASTE.  Please read over the following  sheets that you were given.

## 2019-01-27 ENCOUNTER — Encounter (HOSPITAL_COMMUNITY): Payer: Self-pay

## 2019-01-27 ENCOUNTER — Other Ambulatory Visit (HOSPITAL_COMMUNITY)
Admission: RE | Admit: 2019-01-27 | Discharge: 2019-01-27 | Disposition: A | Discharge status: Home / Self Care | Payer: Commercial Managed Care - PPO | Source: Ambulatory Visit | Attending: Neurosurgery | Admitting: Neurosurgery

## 2019-01-27 ENCOUNTER — Encounter (HOSPITAL_COMMUNITY)
Admission: RE | Admit: 2019-01-27 | Discharge: 2019-01-27 | Disposition: A | Discharge status: Home / Self Care | Payer: Commercial Managed Care - PPO | Source: Ambulatory Visit | Attending: Neurosurgery | Admitting: Neurosurgery

## 2019-01-27 ENCOUNTER — Other Ambulatory Visit: Payer: Self-pay

## 2019-01-27 DIAGNOSIS — M50022 Cervical disc disorder at C5-C6 level with myelopathy: Secondary | ICD-10-CM | POA: Diagnosis not present

## 2019-01-27 DIAGNOSIS — Z6838 Body mass index (BMI) 38.0-38.9, adult: Secondary | ICD-10-CM | POA: Diagnosis not present

## 2019-01-27 DIAGNOSIS — Z7984 Long term (current) use of oral hypoglycemic drugs: Secondary | ICD-10-CM | POA: Diagnosis not present

## 2019-01-27 DIAGNOSIS — M50122 Cervical disc disorder at C5-C6 level with radiculopathy: Secondary | ICD-10-CM | POA: Diagnosis not present

## 2019-01-27 DIAGNOSIS — Z20828 Contact with and (suspected) exposure to other viral communicable diseases: Secondary | ICD-10-CM | POA: Diagnosis not present

## 2019-01-27 DIAGNOSIS — G4730 Sleep apnea, unspecified: Secondary | ICD-10-CM | POA: Diagnosis not present

## 2019-01-27 DIAGNOSIS — E1151 Type 2 diabetes mellitus with diabetic peripheral angiopathy without gangrene: Secondary | ICD-10-CM | POA: Diagnosis not present

## 2019-01-27 DIAGNOSIS — M4802 Spinal stenosis, cervical region: Secondary | ICD-10-CM | POA: Diagnosis not present

## 2019-01-27 DIAGNOSIS — I10 Essential (primary) hypertension: Secondary | ICD-10-CM | POA: Diagnosis not present

## 2019-01-27 DIAGNOSIS — G2581 Restless legs syndrome: Secondary | ICD-10-CM | POA: Diagnosis not present

## 2019-01-27 DIAGNOSIS — E669 Obesity, unspecified: Secondary | ICD-10-CM | POA: Diagnosis not present

## 2019-01-27 DIAGNOSIS — K219 Gastro-esophageal reflux disease without esophagitis: Secondary | ICD-10-CM | POA: Diagnosis not present

## 2019-01-27 DIAGNOSIS — M4712 Other spondylosis with myelopathy, cervical region: Secondary | ICD-10-CM | POA: Diagnosis not present

## 2019-01-27 DIAGNOSIS — G4733 Obstructive sleep apnea (adult) (pediatric): Secondary | ICD-10-CM | POA: Diagnosis not present

## 2019-01-27 DIAGNOSIS — M542 Cervicalgia: Secondary | ICD-10-CM | POA: Diagnosis present

## 2019-01-27 DIAGNOSIS — I2510 Atherosclerotic heart disease of native coronary artery without angina pectoris: Secondary | ICD-10-CM | POA: Diagnosis not present

## 2019-01-27 DIAGNOSIS — E785 Hyperlipidemia, unspecified: Secondary | ICD-10-CM | POA: Diagnosis not present

## 2019-01-27 DIAGNOSIS — I712 Thoracic aortic aneurysm, without rupture: Secondary | ICD-10-CM | POA: Diagnosis not present

## 2019-01-27 DIAGNOSIS — Z7982 Long term (current) use of aspirin: Secondary | ICD-10-CM | POA: Diagnosis not present

## 2019-01-27 DIAGNOSIS — Z79899 Other long term (current) drug therapy: Secondary | ICD-10-CM | POA: Diagnosis not present

## 2019-01-27 DIAGNOSIS — M4722 Other spondylosis with radiculopathy, cervical region: Secondary | ICD-10-CM | POA: Diagnosis not present

## 2019-01-27 DIAGNOSIS — Z01812 Encounter for preprocedural laboratory examination: Secondary | ICD-10-CM | POA: Insufficient documentation

## 2019-01-27 DIAGNOSIS — F17290 Nicotine dependence, other tobacco product, uncomplicated: Secondary | ICD-10-CM | POA: Diagnosis not present

## 2019-01-27 LAB — CBC
HCT: 41.8 % (ref 39.0–52.0)
Hemoglobin: 14.2 g/dL (ref 13.0–17.0)
MCH: 31.8 pg (ref 26.0–34.0)
MCHC: 34 g/dL (ref 30.0–36.0)
MCV: 93.5 fL (ref 80.0–100.0)
Platelets: 229 10*3/uL (ref 150–400)
RBC: 4.47 MIL/uL (ref 4.22–5.81)
RDW: 12.7 % (ref 11.5–15.5)
WBC: 7.8 10*3/uL (ref 4.0–10.5)
nRBC: 0 % (ref 0.0–0.2)

## 2019-01-27 LAB — BASIC METABOLIC PANEL
Anion gap: 10 (ref 5–15)
BUN: 8 mg/dL (ref 8–23)
CO2: 24 mmol/L (ref 22–32)
Calcium: 9.4 mg/dL (ref 8.9–10.3)
Chloride: 104 mmol/L (ref 98–111)
Creatinine, Ser: 1.06 mg/dL (ref 0.61–1.24)
GFR calc Af Amer: >60 mL/min (ref >60)
GFR calc non Af Amer: >60 mL/min (ref >60)
Glucose, Bld: 193 mg/dL — ABNORMAL HIGH (ref 70–99)
Potassium: 3.5 mmol/L (ref 3.5–5.1)
Sodium: 138 mmol/L (ref 135–145)

## 2019-01-27 LAB — SURGICAL PCR SCREEN
MRSA, PCR: NEGATIVE
Staphylococcus aureus: NEGATIVE

## 2019-01-27 LAB — HEMOGLOBIN A1C
Hgb A1c MFr Bld: 7.4 % — ABNORMAL HIGH (ref 4.8–5.6)
Mean Plasma Glucose: 165.68 mg/dL

## 2019-01-27 LAB — TYPE AND SCREEN
ABO/RH(D): O POS
Antibody Screen: NEGATIVE

## 2019-01-27 LAB — GLUCOSE, CAPILLARY: Glucose-Capillary: 201 mg/dL — ABNORMAL HIGH (ref 70–99)

## 2019-01-27 LAB — SARS CORONAVIRUS 2 (TAT 6-24 HRS): SARS Coronavirus 2: NEGATIVE

## 2019-01-27 LAB — ABO/RH: ABO/RH(D): O POS

## 2019-01-27 NOTE — Progress Notes (Signed)
PCP - Marina Goodell Cardiologist - Turner  Chest x-ray - N/A EKG - 08-29-18 ECHO - 07-02-18 Cardiac Cath - 09-02-18  SA - yes, wears CPAP  DM - yes, Type 2, pt states he does not check CBG  Aspirin Instructions: follow your surgeon's instructions on when to stop  Anesthesia review: yes, heart history  Patient denies shortness of breath, fever, cough and chest pain at PAT appointment   Patient verbalized understanding of instructions that were given to them at the PAT appointment. Patient was also instructed that they will need to review over the PAT instructions again at home before surgery.

## 2019-01-28 ENCOUNTER — Telehealth: Payer: Self-pay | Admitting: Internal Medicine

## 2019-01-28 ENCOUNTER — Other Ambulatory Visit: Payer: Self-pay

## 2019-01-28 MED ORDER — VALSARTAN-HYDROCHLOROTHIAZIDE 320-25 MG PO TABS: 1.0000 | ORAL_TABLET | Freq: Every day | ORAL | 3 refills | Status: AC

## 2019-01-28 NOTE — Anesthesia Preprocedure Evaluation (Addendum)
Anesthesia Evaluation  Patient identified by MRN, date of birth, ID band Patient awake    Reviewed: Allergy & Precautions, NPO status , Patient's Chart, lab work & pertinent test results  History of Anesthesia Complications Negative for: history of anesthetic complications  Airway Mallampati: II  TM Distance: >3 FB Neck ROM: Full    Dental  (+) Dental Advisory Given, Teeth Intact   Pulmonary sleep apnea and Continuous Positive Airway Pressure Ventilation , former smoker,    Pulmonary exam normal        Cardiovascular hypertension, Pt. on home beta blockers and Pt. on medications (-) angina+ CAD and + Peripheral Vascular Disease  Normal cardiovascular exam   '20 Cath - Codominant coronary anatomy. Normal left main. Luminal irregularities in the mid LAD.  40% ostial first diagonal.  70% tiny second diagonal. Circumflex gives origin to 3 obtuse marginal branches.  The mid circumflex before the large second obtuse marginal contains eccentric 60 to 70% stenosis. RCA is codominant.  The proximal PDA contains 90% stenosis, preceded by a fusiform aneurysm. Overall normal LV function with EF 50%.  LVEDP is normal  '20 AAA [place] - No evidence of an abdominal aortic aneurysm was visualized. The largest aortic measurement is 2.0 cm. No previous exam available for comparison  Cardiology clearance per telephone encounter 01/15/19 "MAEL DELAP was last seen on 09/12/2018 by Dr. Johnsie Cancel.  Since that day, DONNIE PANIK has done well from a cardiac perspective. He has no complaints while speaking with him today. Per Dr. Radford Pax, it is acceptable to hold ASA as needed for procedure then resume as soon as possible per surgical team. Therefore, based on ACC/AHA guidelines, the patient would be at acceptable risk for the planned procedure without further cardiovascular testing."  TTE 07/22/18: Mild LV wall thickness. Asymmetric moderate LVH of the  basal septum. Grade 1 diastolic dysfunction (impaired relaxation). Mildly dilated left atrial size. Ascending aorta dilatation (45 mm at the mid level).      Neuro/Psych  Vertigo   Neuromuscular disease (RLS) negative psych ROS   GI/Hepatic Neg liver ROS, GERD  Medicated and Controlled,  Endo/Other  diabetes, Type 2, Oral Hypoglycemic Agents Obesity   Renal/GU negative Renal ROS     Musculoskeletal  (+) Arthritis , Osteoarthritis,    Abdominal (+) + obese,   Peds  Hematology negative hematology ROS (+)   Anesthesia Other Findings   Reproductive/Obstetrics                           Anesthesia Physical Anesthesia Plan  ASA: III  Anesthesia Plan: General   Post-op Pain Management:    Induction: Intravenous  PONV Risk Score and Plan: 3 and Treatment may vary due to age or medical condition, Ondansetron and Dexamethasone  Airway Management Planned: Oral ETT and Video Laryngoscope Planned  Additional Equipment: None  Intra-op Plan:   Post-operative Plan: Extubation in OR  Informed Consent: I have reviewed the patients History and Physical, chart, labs and discussed the procedure including the risks, benefits and alternatives for the proposed anesthesia with the patient or authorized representative who has indicated his/her understanding and acceptance.     Dental advisory given  Plan Discussed with: CRNA and Anesthesiologist  Anesthesia Plan Comments: (    )      Anesthesia Quick Evaluation

## 2019-01-28 NOTE — Telephone Encounter (Signed)
Medication: valsartan-hydrochlorothiazide (DIOVAN-HCT) 320-25 MG tablet     Patient is requesting refill of this medication.    Pharmacy:  [id number] - [place], [place] [phone] (Phone) [phone] (Fax)

## 2019-01-30 ENCOUNTER — Ambulatory Visit (HOSPITAL_COMMUNITY): Payer: Commercial Managed Care - PPO | Admitting: Physician Assistant

## 2019-01-30 ENCOUNTER — Encounter (HOSPITAL_COMMUNITY): Payer: Self-pay | Admitting: *Deleted

## 2019-01-30 ENCOUNTER — Ambulatory Visit (HOSPITAL_COMMUNITY): Payer: Commercial Managed Care - PPO

## 2019-01-30 ENCOUNTER — Ambulatory Visit (HOSPITAL_COMMUNITY)
Admission: RE | Admit: 2019-01-30 | Discharge: 2019-01-31 | Disposition: A | Discharge status: Home / Self Care | Payer: Commercial Managed Care - PPO | Attending: Neurosurgery | Admitting: Neurosurgery

## 2019-01-30 ENCOUNTER — Encounter (HOSPITAL_COMMUNITY)
Admission: RE | Disposition: A | Discharge status: Home / Self Care | Payer: Self-pay | Source: Home / Self Care | Attending: Neurosurgery

## 2019-01-30 ENCOUNTER — Other Ambulatory Visit: Payer: Self-pay

## 2019-01-30 ENCOUNTER — Ambulatory Visit (HOSPITAL_COMMUNITY): Payer: Commercial Managed Care - PPO | Admitting: Anesthesiology

## 2019-01-30 DIAGNOSIS — G4733 Obstructive sleep apnea (adult) (pediatric): Secondary | ICD-10-CM | POA: Insufficient documentation

## 2019-01-30 DIAGNOSIS — E669 Obesity, unspecified: Secondary | ICD-10-CM | POA: Insufficient documentation

## 2019-01-30 DIAGNOSIS — I10 Essential (primary) hypertension: Secondary | ICD-10-CM | POA: Insufficient documentation

## 2019-01-30 DIAGNOSIS — M50022 Cervical disc disorder at C5-C6 level with myelopathy: Secondary | ICD-10-CM | POA: Diagnosis not present

## 2019-01-30 DIAGNOSIS — F17290 Nicotine dependence, other tobacco product, uncomplicated: Secondary | ICD-10-CM | POA: Insufficient documentation

## 2019-01-30 DIAGNOSIS — M4802 Spinal stenosis, cervical region: Secondary | ICD-10-CM | POA: Insufficient documentation

## 2019-01-30 DIAGNOSIS — K219 Gastro-esophageal reflux disease without esophagitis: Secondary | ICD-10-CM | POA: Insufficient documentation

## 2019-01-30 DIAGNOSIS — E1151 Type 2 diabetes mellitus with diabetic peripheral angiopathy without gangrene: Secondary | ICD-10-CM | POA: Insufficient documentation

## 2019-01-30 DIAGNOSIS — Z419 Encounter for procedure for purposes other than remedying health state, unspecified: Secondary | ICD-10-CM

## 2019-01-30 DIAGNOSIS — I712 Thoracic aortic aneurysm, without rupture: Secondary | ICD-10-CM | POA: Insufficient documentation

## 2019-01-30 DIAGNOSIS — M50122 Cervical disc disorder at C5-C6 level with radiculopathy: Secondary | ICD-10-CM | POA: Insufficient documentation

## 2019-01-30 DIAGNOSIS — Z6838 Body mass index (BMI) 38.0-38.9, adult: Secondary | ICD-10-CM | POA: Insufficient documentation

## 2019-01-30 DIAGNOSIS — M4712 Other spondylosis with myelopathy, cervical region: Secondary | ICD-10-CM | POA: Diagnosis present

## 2019-01-30 DIAGNOSIS — G4730 Sleep apnea, unspecified: Secondary | ICD-10-CM | POA: Insufficient documentation

## 2019-01-30 DIAGNOSIS — Z79899 Other long term (current) drug therapy: Secondary | ICD-10-CM | POA: Insufficient documentation

## 2019-01-30 DIAGNOSIS — Z7982 Long term (current) use of aspirin: Secondary | ICD-10-CM | POA: Insufficient documentation

## 2019-01-30 DIAGNOSIS — E785 Hyperlipidemia, unspecified: Secondary | ICD-10-CM | POA: Insufficient documentation

## 2019-01-30 DIAGNOSIS — M4722 Other spondylosis with radiculopathy, cervical region: Secondary | ICD-10-CM | POA: Insufficient documentation

## 2019-01-30 DIAGNOSIS — Z20828 Contact with and (suspected) exposure to other viral communicable diseases: Secondary | ICD-10-CM | POA: Insufficient documentation

## 2019-01-30 DIAGNOSIS — Z7984 Long term (current) use of oral hypoglycemic drugs: Secondary | ICD-10-CM | POA: Insufficient documentation

## 2019-01-30 DIAGNOSIS — I2510 Atherosclerotic heart disease of native coronary artery without angina pectoris: Secondary | ICD-10-CM | POA: Insufficient documentation

## 2019-01-30 DIAGNOSIS — G2581 Restless legs syndrome: Secondary | ICD-10-CM | POA: Insufficient documentation

## 2019-01-30 HISTORY — PX: ANTERIOR CERVICAL DECOMP/DISCECTOMY FUSION: SHX1161

## 2019-01-30 LAB — GLUCOSE, CAPILLARY
Glucose-Capillary: 141 mg/dL — ABNORMAL HIGH (ref 70–99)
Glucose-Capillary: 114 mg/dL — ABNORMAL HIGH (ref 70–99)
Glucose-Capillary: 161 mg/dL — ABNORMAL HIGH (ref 70–99)
Glucose-Capillary: 195 mg/dL — ABNORMAL HIGH (ref 70–99)
Glucose-Capillary: 331 mg/dL — ABNORMAL HIGH (ref 70–99)

## 2019-01-30 SURGERY — ANTERIOR CERVICAL DECOMPRESSION/DISCECTOMY FUSION 2 LEVELS
Anesthesia: General | Site: Neck

## 2019-01-30 MED ORDER — HYDROCHLOROTHIAZIDE 25 MG PO TABS: 25.0000 mg | ORAL_TABLET | Freq: Every day | ORAL | Status: DC

## 2019-01-30 MED ORDER — SODIUM CHLORIDE 0.9 % IV SOLN
INTRAVENOUS | Status: DC | PRN
  Administered 2019-01-30: 500 mL

## 2019-01-30 MED ORDER — DEXAMETHASONE SODIUM PHOSPHATE 4 MG/ML IJ SOLN
4.0000 mg | Freq: Four times a day (QID) | INTRAMUSCULAR | Status: AC
  Administered 2019-01-30 (×2): 4 mg via INTRAVENOUS
  Filled 2019-01-30: qty 1

## 2019-01-30 MED ORDER — FENTANYL CITRATE (PF) 250 MCG/5ML IJ SOLN
INTRAMUSCULAR | Status: AC
  Filled 2019-01-30: qty 5

## 2019-01-30 MED ORDER — VALSARTAN-HYDROCHLOROTHIAZIDE 320-25 MG PO TABS: 1.0000 | ORAL_TABLET | Freq: Every day | ORAL | Status: DC

## 2019-01-30 MED ORDER — LIDOCAINE HCL (CARDIAC) PF 100 MG/5ML IV SOSY
PREFILLED_SYRINGE | INTRAVENOUS | Status: DC | PRN
  Administered 2019-01-30: 30 mg via INTRAVENOUS

## 2019-01-30 MED ORDER — PHENOL 1.4 % MT LIQD: 1.0000 | OROMUCOSAL | Status: DC | PRN

## 2019-01-30 MED ORDER — INSULIN ASPART 100 UNIT/ML ~~LOC~~ SOLN
0.0000 [IU] | Freq: Every day | SUBCUTANEOUS | Status: DC
  Administered 2019-01-30: 4 [IU] via SUBCUTANEOUS

## 2019-01-30 MED ORDER — ALUM & MAG HYDROXIDE-SIMETH 200-200-20 MG/5ML PO SUSP: 30.0000 mL | Freq: Four times a day (QID) | ORAL | Status: DC | PRN

## 2019-01-30 MED ORDER — MORPHINE SULFATE (PF) 4 MG/ML IV SOLN
4.0000 mg | INTRAVENOUS | Status: DC | PRN
  Administered 2019-01-30: 18:00:00 4 mg via INTRAVENOUS

## 2019-01-30 MED ORDER — DOCUSATE SODIUM 100 MG PO CAPS
100.0000 mg | ORAL_CAPSULE | Freq: Two times a day (BID) | ORAL | Status: DC
  Administered 2019-01-30: 100 mg via ORAL
  Filled 2019-01-30: qty 1

## 2019-01-30 MED ORDER — THROMBIN 5000 UNITS EX SOLR
OROMUCOSAL | Status: DC | PRN
  Administered 2019-01-30 (×2): 5 mL via TOPICAL

## 2019-01-30 MED ORDER — ATORVASTATIN CALCIUM 80 MG PO TABS
80.0000 mg | ORAL_TABLET | Freq: Every day | ORAL | Status: DC
  Administered 2019-01-30: 80 mg via ORAL

## 2019-01-30 MED ORDER — CEFAZOLIN SODIUM-DEXTROSE 2-4 GM/100ML-% IV SOLN
INTRAVENOUS | Status: AC
  Filled 2019-01-30: qty 100

## 2019-01-30 MED ORDER — MENTHOL 3 MG MT LOZG
1.0000 | LOZENGE | OROMUCOSAL | Status: DC | PRN
  Filled 2019-01-30: qty 9

## 2019-01-30 MED ORDER — BACITRACIN ZINC 500 UNIT/GM EX OINT
TOPICAL_OINTMENT | CUTANEOUS | Status: DC | PRN
  Administered 2019-01-30: 1 via TOPICAL

## 2019-01-30 MED ORDER — BISACODYL 10 MG RE SUPP: 10.0000 mg | Freq: Every day | RECTAL | Status: DC | PRN

## 2019-01-30 MED ORDER — OXYCODONE HCL 5 MG PO TABS
10.0000 mg | ORAL_TABLET | ORAL | Status: DC | PRN
  Administered 2019-01-30 – 2019-01-31 (×4): 10 mg via ORAL
  Filled 2019-01-30 (×4): qty 2

## 2019-01-30 MED ORDER — PROPOFOL 10 MG/ML IV BOLUS
INTRAVENOUS | Status: DC | PRN
  Administered 2019-01-30: 200 mg via INTRAVENOUS

## 2019-01-30 MED ORDER — CEFAZOLIN SODIUM-DEXTROSE 2-4 GM/100ML-% IV SOLN
2.0000 g | Freq: Three times a day (TID) | INTRAVENOUS | Status: AC
  Administered 2019-01-30 – 2019-01-31 (×2): 2 g via INTRAVENOUS
  Filled 2019-01-30 (×2): qty 100

## 2019-01-30 MED ORDER — CHLORHEXIDINE GLUCONATE CLOTH 2 % EX PADS: 6.0000 | MEDICATED_PAD | Freq: Once | CUTANEOUS | Status: DC

## 2019-01-30 MED ORDER — NITROGLYCERIN 0.4 MG SL SUBL: 0.4000 mg | SUBLINGUAL_TABLET | SUBLINGUAL | Status: DC | PRN

## 2019-01-30 MED ORDER — METFORMIN HCL 500 MG PO TABS
500.0000 mg | ORAL_TABLET | Freq: Three times a day (TID) | ORAL | Status: DC
  Administered 2019-01-31: 500 mg via ORAL
  Filled 2019-01-30: qty 1

## 2019-01-30 MED ORDER — ADULT MULTIVITAMIN W/MINERALS CH: 1.0000 | ORAL_TABLET | Freq: Every day | ORAL | Status: DC

## 2019-01-30 MED ORDER — ONDANSETRON HCL 4 MG/2ML IJ SOLN: 4.0000 mg | Freq: Four times a day (QID) | INTRAMUSCULAR | Status: DC | PRN

## 2019-01-30 MED ORDER — INSULIN ASPART 100 UNIT/ML ~~LOC~~ SOLN: 0.0000 [IU] | Freq: Three times a day (TID) | SUBCUTANEOUS | Status: DC

## 2019-01-30 MED ORDER — CYCLOBENZAPRINE HCL 10 MG PO TABS
10.0000 mg | ORAL_TABLET | Freq: Three times a day (TID) | ORAL | Status: DC | PRN
  Administered 2019-01-30 – 2019-01-31 (×3): 10 mg via ORAL
  Filled 2019-01-30 (×2): qty 1

## 2019-01-30 MED ORDER — SODIUM CHLORIDE 0.9 % IV SOLN
INTRAVENOUS | Status: DC | PRN
  Administered 2019-01-30: 14:00:00 10 ug/min via INTRAVENOUS

## 2019-01-30 MED ORDER — BACITRACIN ZINC 500 UNIT/GM EX OINT
TOPICAL_OINTMENT | CUTANEOUS | Status: AC
  Filled 2019-01-30: qty 28.35

## 2019-01-30 MED ORDER — HEMOSTATIC AGENTS (NO CHARGE) OPTIME
TOPICAL | Status: DC | PRN
  Administered 2019-01-30 (×2): 1 via TOPICAL

## 2019-01-30 MED ORDER — CEFAZOLIN SODIUM-DEXTROSE 2-4 GM/100ML-% IV SOLN
2.0000 g | INTRAVENOUS | Status: AC
  Administered 2019-01-30: 2 g via INTRAVENOUS

## 2019-01-30 MED ORDER — ACETAMINOPHEN 500 MG PO TABS
1000.0000 mg | ORAL_TABLET | Freq: Four times a day (QID) | ORAL | Status: DC
  Administered 2019-01-30 – 2019-01-31 (×3): 1000 mg via ORAL
  Filled 2019-01-30 (×2): qty 2

## 2019-01-30 MED ORDER — BUPIVACAINE-EPINEPHRINE (PF) 0.5% -1:200000 IJ SOLN
INTRAMUSCULAR | Status: AC
  Filled 2019-01-30: qty 30

## 2019-01-30 MED ORDER — CYCLOBENZAPRINE HCL 10 MG PO TABS
ORAL_TABLET | ORAL | Status: AC
  Filled 2019-01-30: qty 1

## 2019-01-30 MED ORDER — OXYCODONE HCL 5 MG PO TABS: 5.0000 mg | ORAL_TABLET | ORAL | Status: DC | PRN

## 2019-01-30 MED ORDER — PANTOPRAZOLE SODIUM 40 MG PO TBEC: 80.0000 mg | DELAYED_RELEASE_TABLET | Freq: Every day | ORAL | Status: DC

## 2019-01-30 MED ORDER — BUPIVACAINE-EPINEPHRINE (PF) 0.5% -1:200000 IJ SOLN
INTRAMUSCULAR | Status: DC | PRN
  Administered 2019-01-30: 10 mL via PERINEURAL

## 2019-01-30 MED ORDER — LACTATED RINGERS IV SOLN
INTRAVENOUS | Status: DC
  Administered 2019-01-30 (×2): via INTRAVENOUS

## 2019-01-30 MED ORDER — ONDANSETRON HCL 4 MG PO TABS: 4.0000 mg | ORAL_TABLET | Freq: Four times a day (QID) | ORAL | Status: DC | PRN

## 2019-01-30 MED ORDER — PANTOPRAZOLE SODIUM 40 MG IV SOLR: 40.0000 mg | Freq: Every day | INTRAVENOUS | Status: DC

## 2019-01-30 MED ORDER — SUGAMMADEX SODIUM 200 MG/2ML IV SOLN
INTRAVENOUS | Status: DC | PRN
  Administered 2019-01-30: 400 mg via INTRAVENOUS

## 2019-01-30 MED ORDER — LACTATED RINGERS IV SOLN: INTRAVENOUS | Status: DC

## 2019-01-30 MED ORDER — ACETAMINOPHEN 325 MG PO TABS: 650.0000 mg | ORAL_TABLET | ORAL | Status: DC | PRN

## 2019-01-30 MED ORDER — DEXAMETHASONE 4 MG PO TABS: 4.0000 mg | ORAL_TABLET | Freq: Four times a day (QID) | ORAL | Status: AC

## 2019-01-30 MED ORDER — DEXAMETHASONE SODIUM PHOSPHATE 10 MG/ML IJ SOLN
INTRAMUSCULAR | Status: DC | PRN
  Administered 2019-01-30: 10 mg via INTRAVENOUS

## 2019-01-30 MED ORDER — REPAGLINIDE 1 MG PO TABS
1.0000 mg | ORAL_TABLET | Freq: Three times a day (TID) | ORAL | Status: DC
  Administered 2019-01-31: 04:00:00 1 mg via ORAL
  Filled 2019-01-30 (×3): qty 1

## 2019-01-30 MED ORDER — INSULIN ASPART 100 UNIT/ML ~~LOC~~ SOLN
0.0000 [IU] | Freq: Three times a day (TID) | SUBCUTANEOUS | Status: DC
  Administered 2019-01-31: 07:00:00 11 [IU] via SUBCUTANEOUS

## 2019-01-30 MED ORDER — ONDANSETRON HCL 4 MG/2ML IJ SOLN
INTRAMUSCULAR | Status: DC | PRN
  Administered 2019-01-30: 4 mg via INTRAVENOUS

## 2019-01-30 MED ORDER — THROMBIN 5000 UNITS EX SOLR
CUTANEOUS | Status: AC
  Filled 2019-01-30: qty 5000

## 2019-01-30 MED ORDER — 0.9 % SODIUM CHLORIDE (POUR BTL) OPTIME
TOPICAL | Status: DC | PRN
  Administered 2019-01-30: 1000 mL

## 2019-01-30 MED ORDER — IRBESARTAN 300 MG PO TABS
300.0000 mg | ORAL_TABLET | Freq: Every day | ORAL | Status: DC
  Filled 2019-01-30: qty 1

## 2019-01-30 MED ORDER — ACETAMINOPHEN 650 MG RE SUPP: 650.0000 mg | RECTAL | Status: DC | PRN

## 2019-01-30 MED ORDER — METOPROLOL SUCCINATE ER 50 MG PO TB24: 50.0000 mg | ORAL_TABLET | Freq: Every day | ORAL | Status: DC

## 2019-01-30 MED ORDER — FENTANYL CITRATE (PF) 100 MCG/2ML IJ SOLN
INTRAMUSCULAR | Status: DC | PRN
  Administered 2019-01-30 (×2): 50 ug via INTRAVENOUS
  Administered 2019-01-30: 100 ug via INTRAVENOUS
  Administered 2019-01-30: 50 ug via INTRAVENOUS

## 2019-01-30 MED ORDER — ROCURONIUM BROMIDE 50 MG/5ML IV SOSY
PREFILLED_SYRINGE | INTRAVENOUS | Status: DC | PRN
  Administered 2019-01-30: 20 mg via INTRAVENOUS
  Administered 2019-01-30: 70 mg via INTRAVENOUS
  Administered 2019-01-30: 10 mg via INTRAVENOUS

## 2019-01-30 MED ORDER — ROPINIROLE HCL 1 MG PO TABS
1.0000 mg | ORAL_TABLET | Freq: Every day | ORAL | Status: DC
  Administered 2019-01-30: 1 mg via ORAL
  Filled 2019-01-30: qty 1

## 2019-01-30 MED ORDER — INSULIN ASPART 100 UNIT/ML ~~LOC~~ SOLN
0.0000 [IU] | SUBCUTANEOUS | Status: DC
  Administered 2019-01-30: 4 [IU] via SUBCUTANEOUS

## 2019-01-30 SURGICAL SUPPLY — 63 items
BAG DECANTER FOR FLEXI CONT (MISCELLANEOUS) ×2 IMPLANT
BENZOIN TINCTURE PRP APPL 2/3 (GAUZE/BANDAGES/DRESSINGS) ×2 IMPLANT
BIT DRILL NEURO 2X3.1 SFT TUCH (MISCELLANEOUS) ×1 IMPLANT
BLADE SURG 15 STRL LF DISP TIS (BLADE) ×1 IMPLANT
BLADE SURG 15 STRL SS (BLADE) ×1
BLADE ULTRA TIP 2M (BLADE) ×2 IMPLANT
BUR BARREL STRAIGHT FLUTE 4.0 (BURR) ×2 IMPLANT
BUR MATCHSTICK NEURO 3.0 LAGG (BURR) ×2 IMPLANT
CANISTER SUCT 3000ML PPV (MISCELLANEOUS) ×2 IMPLANT
CARTRIDGE OIL MAESTRO DRILL (MISCELLANEOUS) ×1 IMPLANT
COVER MAYO STAND STRL (DRAPES) ×2 IMPLANT
COVER WAND RF STERILE (DRAPES) ×2 IMPLANT
DECANTER SPIKE VIAL GLASS SM (MISCELLANEOUS) ×2 IMPLANT
DIFFUSER DRILL AIR PNEUMATIC (MISCELLANEOUS) ×2 IMPLANT
DRAPE LAPAROTOMY 100X72 PEDS (DRAPES) ×2 IMPLANT
DRAPE MICROSCOPE LEICA (MISCELLANEOUS) IMPLANT
DRAPE POUCH INSTRU U-SHP 10X18 (DRAPES) ×2 IMPLANT
DRAPE SURG 17X23 STRL (DRAPES) ×4 IMPLANT
DRILL NEURO 2X3.1 SOFT TOUCH (MISCELLANEOUS) ×2
DRSG OPSITE POSTOP 4X6 (GAUZE/BANDAGES/DRESSINGS) ×2 IMPLANT
ELECT BLADE 4.0 EZ CLEAN MEGAD (MISCELLANEOUS) ×2
ELECT REM PT RETURN 9FT ADLT (ELECTROSURGICAL) ×2
ELECTRODE BLDE 4.0 EZ CLN MEGD (MISCELLANEOUS) ×1 IMPLANT
ELECTRODE REM PT RTRN 9FT ADLT (ELECTROSURGICAL) ×1 IMPLANT
GAUZE 4X4 16PLY RFD (DISPOSABLE) IMPLANT
GAUZE SPONGE 4X4 12PLY STRL (GAUZE/BANDAGES/DRESSINGS) ×2 IMPLANT
GLOVE BIO SURGEON STRL SZ8 (GLOVE) ×2 IMPLANT
GLOVE BIO SURGEON STRL SZ8.5 (GLOVE) ×2 IMPLANT
GLOVE BIOGEL PI IND STRL 7.0 (GLOVE) ×3 IMPLANT
GLOVE BIOGEL PI INDICATOR 7.0 (GLOVE) ×3
GLOVE EXAM NITRILE XL STR (GLOVE) IMPLANT
GLOVE SURG SS PI 7.0 STRL IVOR (GLOVE) ×4 IMPLANT
GOWN STRL REUS W/ TWL LRG LVL3 (GOWN DISPOSABLE) ×1 IMPLANT
GOWN STRL REUS W/ TWL XL LVL3 (GOWN DISPOSABLE) ×2 IMPLANT
GOWN STRL REUS W/TWL LRG LVL3 (GOWN DISPOSABLE) ×1
GOWN STRL REUS W/TWL XL LVL3 (GOWN DISPOSABLE) ×2
HEMOSTAT POWDER KIT SURGIFOAM (HEMOSTASIS) ×4 IMPLANT
KIT BASIN OR (CUSTOM PROCEDURE TRAY) ×2 IMPLANT
KIT TURNOVER KIT B (KITS) ×2 IMPLANT
MARKER SKIN DUAL TIP RULER LAB (MISCELLANEOUS) ×2 IMPLANT
NEEDLE HYPO 22GX1.5 SAFETY (NEEDLE) ×2 IMPLANT
NEEDLE SPNL 18GX3.5 QUINCKE PK (NEEDLE) ×2 IMPLANT
NS IRRIG 1000ML POUR BTL (IV SOLUTION) ×2 IMPLANT
OIL CARTRIDGE MAESTRO DRILL (MISCELLANEOUS) ×2
PACK LAMINECTOMY NEURO (CUSTOM PROCEDURE TRAY) ×2 IMPLANT
PATTIES SURGICAL 1X1 (DISPOSABLE) ×2 IMPLANT
PEEK VISTA 14X14X7MM (Peek) ×4 IMPLANT
PIN DISTRACTION 14MM (PIN) ×4 IMPLANT
PLATE ANT CERV XTEND 2 LV 28 (Plate) ×2 IMPLANT
PUTTY DBM 2CC CALC GRAN (Putty) ×2 IMPLANT
RUBBERBAND STERILE (MISCELLANEOUS) IMPLANT
SCREW XTD VAR 4.2 SELF TAP (Screw) ×12 IMPLANT
SEALANT ADHERUS EXTEND TIP (MISCELLANEOUS) ×2 IMPLANT
SET WALTER ACTIVATION W/DRAPE (SET/KITS/TRAYS/PACK) ×4 IMPLANT
SPONGE INTESTINAL PEANUT (DISPOSABLE) ×4 IMPLANT
SPONGE SURGIFOAM ABS GEL SZ50 (HEMOSTASIS) ×2 IMPLANT
STRIP CLOSURE SKIN 1/2X4 (GAUZE/BANDAGES/DRESSINGS) ×2 IMPLANT
SUT VIC AB 0 CT1 27 (SUTURE) ×1
SUT VIC AB 0 CT1 27XBRD ANTBC (SUTURE) ×1 IMPLANT
SUT VIC AB 3-0 SH 8-18 (SUTURE) ×2 IMPLANT
TOWEL GREEN STERILE (TOWEL DISPOSABLE) ×2 IMPLANT
TOWEL GREEN STERILE FF (TOWEL DISPOSABLE) ×2 IMPLANT
WATER STERILE IRR 1000ML POUR (IV SOLUTION) ×2 IMPLANT

## 2019-01-30 NOTE — Progress Notes (Signed)
Patient was placed on CPAP of 11 cmH2O per home settings via patient's home CPAP FFM mask.  Patient is tolerating at this time.

## 2019-01-30 NOTE — Progress Notes (Signed)
Subjective: The patient is alert and pleasant.  He looks well.  He is in no apparent distress.  Objective: Vital signs in last 24 hours: Temp:  [98 F (36.7 C)-98.1 F (36.7 C)] 98 F (36.7 C) (08/06 1615) Pulse Rate:  [52-71] 71 (08/06 1630) Resp:  [13-20] 20 (08/06 1630) BP: (133-183)/(77-95) 133/77 (08/06 1615) SpO2:  [95 %-99 %] 96 % (08/06 1630) Weight:  [108.9 kg] 108.9 kg (08/06 1047) Estimated body mass index is 38.74 kg/m as calculated from the following:   Height as of this encounter: 5\' 6"  (1.676 m).   Weight as of this encounter: 108.9 kg.   Intake/Output from previous day: No intake/output data recorded. Intake/Output this shift: Total I/O In: 1600 [I.V.:1600] Out: 100 [Blood:100]  Physical exam the patient is alert and pleasant.  He is moving all 4 extremities well.  His dressing is clean and dry.  There is no hematoma or shift.  Lab Results: No results for input(s): WBC, HGB, HCT, PLT in the last 72 hours. BMET No results for input(s): NA, K, CL, CO2, GLUCOSE, BUN, CREATININE, CALCIUM in the last 72 hours.  Studies/Results: No results found.  Assessment/Plan: The patient is doing well.  I spoke with his wife.  LOS: 0 days     Jerry Cohen 01/30/2019, 4:42 PM

## 2019-01-30 NOTE — Op Note (Signed)
Brief history: The patient is a 63 year old white male who has complained of neck and right greater than left shoulder and arm pain, numbness, tingling and weakness.  He has failed medical management and was worked up with a cervical MRI.  This demonstrated significant spondylosis and stenosis at C5-6 and C6-7.  I discussed the various treatment options with him including surgery.  He has weighed the risks, benefits and alternatives surgery and decided to proceed with a C5-6 and C6-7 anterior cervical discectomy, fusion and plating.  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 Earle Gell  Asst.: Arnetha Massy nurse practitioner  Anesthesia: Gen. endotracheal  Estimated blood loss: 125 cc  Drains: None  Complications: Durotomy  Description of procedure: The patient was brought to the operating room by the anesthesia team. General endotracheal anesthesia was induced. A roll was placed under the patient's shoulders to keep the neck in the neutral position. The patient's anterior cervical region was then prepared with Betadine scrub and Betadine solution. Sterile drapes were applied.  The area to be incised was then injected with Marcaine with epinephrine solution. I then used a scalpel to make a transverse incision in the patient's left anterior neck. I used the Metzenbaum scissors to divide the platysmal muscle and then to dissect medial to the sternocleidomastoid muscle, jugular vein, and carotid artery. I carefully dissected down towards the  anterior cervical spine identifying the esophagus and retracting it medially. Then using Kitner swabs to clear soft tissue from the anterior cervical spine. We then inserted a bent spinal needle into the upper exposed intervertebral disc space. We then obtained intraoperative radiographs confirm our location.  I then used electrocautery to detach the medial border of the longus colli muscle bilaterally from the C5-6 and C6-7 intervertebral disc spaces. I then inserted the Caspar self-retaining retractor underneath the longus colli muscle bilaterally to provide exposure.  We then incised the intervertebral disc at C5-6. We then performed a partial intervertebral discectomy with a pituitary forceps and the Karlin curettes. I then inserted distraction screws into the vertebral bodies at C5-6. We then distracted the interspace. We then used the high-speed drill to decorticate the vertebral endplates at T6-2, to drill away the remainder of the intervertebral disc, to drill away some posterior spondylosis, and to thin out the posterior longitudinal ligament. I then incised ligament with the arachnoid knife. We then removed the ligament with a Kerrison punches undercutting the vertebral endplates and decompressing the thecal sac.  The right central bone spur was quite adherent to the dura.  In removing it we did create a durotomy.  We then performed foraminotomies about the bilateral 6 nerve roots. This completed the decompression at this level.  I placed DuraSeal over the durotomy.  I then repeated this procedure in analogous fashion at C6-7 decompressing the thecal sac and the bilateral C7 nerve roots.  We now turned our to attention to the interbody fusion. We used the trial spacers to determine the appropriate size for the interbody prosthesis. We then pre-filled prosthesis with a combination of local morcellized autograft bone that we obtained during decompression as well as Zimmer DBM. We then inserted the  prosthesis  into the distracted interspace at C5-6 and C6-7. We then removed the distraction screws. There was a good snug fit of the prosthesis in the interspace.  Having completed the fusion we now turned attention to the anterior spinal instrumentation. We used the high-speed drill to drill away some anterior spondylosis at the disc spaces so that the plate lay down flat. We selected the appropriate length titanium anterior cervical plate. We laid it along the anterior aspect of the vertebral bodies from C5-C7. We then drilled 14 mm holes at C5, C6 and C7. We then secured the plate to the vertebral bodies by placing two 14 mm self-tapping screws at C5, C6 and C7. We then obtained intraoperative radiograph. The demonstrating good position of the instrumentation. We therefore secured the screws the plate the locking each cam. This completed the instrumentation.  We then obtained hemostasis using bipolar electrocautery. We irrigated the wound out with bacitracin solution. We then removed the retractor. We inspected the esophagus for any damage. There was none apparent. We then reapproximated patient's platysmal muscle with interrupted 3-0 Vicryl suture. We then reapproximated the subcutaneous tissue with interrupted 3-0 Vicryl suture. The skin was reapproximated with Steri-Strips and benzoin. The wound was then covered with bacitracin ointment. A sterile dressing was applied. The drapes were removed. Patient was subsequently extubated by the anesthesia team and transported to the post anesthesia care unit in stable condition. All sponge instrument and needle counts were reportedly correct at the end of this case.

## 2019-01-30 NOTE — Anesthesia Procedure Notes (Signed)
Procedure Name: Intubation Date/Time: 01/30/2019 1:27 PM Performed by: Eligha Bridegroom, CRNA Pre-anesthesia Checklist: Patient identified, Emergency Drugs available, Suction available and Patient being monitored Oxygen Delivery Method: Circle system utilized Preoxygenation: Pre-oxygenation with 100% oxygen Induction Type: IV induction Ventilation: Mask ventilation without difficulty and Oral airway inserted - appropriate to patient size Laryngoscope Size: Glidescope Grade View: Grade I Tube type: Oral Tube size: 7.5 mm Number of attempts: 1 Airway Equipment and Method: Video-laryngoscopy and Stylet Placement Confirmation: ETT inserted through vocal cords under direct vision,  breath sounds checked- equal and bilateral and positive ETCO2 Secured at: 22 cm Tube secured with: Tape Dental Injury: Teeth and Oropharynx as per pre-operative assessment

## 2019-01-30 NOTE — Anesthesia Postprocedure Evaluation (Signed)
Anesthesia Post Note  Patient: Jerry Cohen  Procedure(s) Performed: ANTERIOR CERVICAL DECOMPRESSION/DISCECTOMY FUSION, INTERBODY PROSTHESIS, PLATE/SCREWS CERVICAL FIVE CERVICAL SIX, CERVICAL SIX- CERVICAL SEVEN (N/A Neck)     Patient location during evaluation: PACU Anesthesia Type: General Level of consciousness: awake and alert Pain management: pain level controlled Vital Signs Assessment: post-procedure vital signs reviewed and stable Respiratory status: spontaneous breathing, nonlabored ventilation and respiratory function stable Cardiovascular status: blood pressure returned to baseline and stable Postop Assessment: no apparent nausea or vomiting Anesthetic complications: no    Last Vitals:  Vitals:   01/30/19 1645 01/30/19 1725  BP: 136/70 (!) 151/82  Pulse: 68 71  Resp: (!) 22 18  Temp: 36.5 C 36.5 C  SpO2: 98% 94%    Last Pain:  Vitals:   01/30/19 1725  TempSrc: Oral  PainSc:                  Audry Pili

## 2019-01-30 NOTE — Transfer of Care (Signed)
Immediate Anesthesia Transfer of Care Note  Patient: Jerry Cohen  Procedure(s) Performed: ANTERIOR CERVICAL DECOMPRESSION/DISCECTOMY FUSION, INTERBODY PROSTHESIS, PLATE/SCREWS CERVICAL FIVE CERVICAL SIX, CERVICAL SIX- CERVICAL SEVEN (N/A Neck)  Patient Location: PACU  Anesthesia Type:General  Level of Consciousness: awake and alert   Airway & Oxygen Therapy: Patient Spontanous Breathing  Post-op Assessment: Report given to RN and Post -op Vital signs reviewed and stable  Post vital signs: Reviewed and stable  Last Vitals:  Vitals Value Taken Time  BP 133/77 01/30/19 1615  Temp    Pulse 68 01/30/19 1616  Resp 13 01/30/19 1616  SpO2 93 % 01/30/19 1616  Vitals shown include unvalidated device data.  Last Pain:  Vitals:   01/30/19 1059  TempSrc:   PainSc: 0-No pain      Patients Stated Pain Goal: 0 (94/[id number])  Complications: No apparent anesthesia complications

## 2019-01-30 NOTE — H&P (Signed)
Subjective: The patient is a 63 year old male who has complained of neck and arm pain and numbness consistent with a cervical radiculopathy.  He has failed medical management and was worked up with a cervical MRI which demonstrated spondylosis and stenosis at C5-6 and C6-7.  I discussed the various treatment options with him.  He has weighed the risks, benefits and alternatives of surgery and decided proceed with C5-6 and C6-7 anterior cervical discectomy, fusion and plating.  Past Medical History:  Diagnosis Date  . Ascending aortic aneurysm ([place]) 07/19/2018  . Coronary artery calcification seen on CAT scan 07/19/2018  . Diabetes mellitus without complication ([place])   . Dizziness 2012  . ED (erectile dysfunction)   . GERD (gastroesophageal reflux disease)   . Hx of colonic polyps    Dr Fuller Plan  . Hyperlipidemia   . Hypertension   . OSA on CPAP   . Osteoarthritis     Past Surgical History:  Procedure Laterality Date  . APPENDECTOMY  1996  . COLONOSCOPY  2015  . KNEE ARTHROSCOPY Left 2009  . LASIK Bilateral 2007  . LEFT HEART CATH AND CORONARY ANGIOGRAPHY N/A 09/02/2018   Procedure: LEFT HEART CATH AND CORONARY ANGIOGRAPHY;  Surgeon: Belva Crome, MD;  Location: [facility];  Service: Cardiovascular;  Laterality: N/A;  . SHOULDER ARTHROSCOPY W/ ROTATOR CUFF REPAIR Left 2010    No Known Allergies  Social History   Tobacco Use  . Smoking status: Current Some Day Smoker    Types: Cigars  . Smokeless tobacco: Never Used  . Tobacco comment: quit smoking 30 years ago.  Substance Use Topics  . Alcohol use: Yes    Alcohol/week: 0.0 standard drinks    Comment: about 2 times per month    Family History  Problem Relation Age of Onset  . Cancer Father        kidney and prostate   . Colon cancer Father 73  . Cancer Other        colon- <60  . Colon cancer Other 30  . Mental illness Mother        dementia  . Cancer Sister        colon cancer  . Colon cancer Sister 59  . Colon  cancer Sister 47  . Coronary artery disease Neg Hx    Prior to Admission medications   Medication Sig Start Date End Date Taking? Authorizing Provider  aspirin 81 MG EC tablet Take 81 mg by mouth daily.     Yes [provider]  atorvastatin (LIPITOR) 80 MG tablet Take 1 tablet (80 mg total) by mouth daily. Patient taking differently: Take 80 mg by mouth every evening.  07/19/18 07/19/19 Yes Turner, Eber Hong, MD  Cholecalciferol (EQL VITAMIN D3) 25 MCG (1000 UT) tablet Take 2 tablets (2,000 Units total) by mouth daily. 06/03/18  Yes Plotnikov, Evie Lacks, MD  Cyanocobalamin (VITAMIN B-12 PO) Take 1 tablet by mouth daily.   Yes [provider]  esomeprazole (NEXIUM) 40 MG capsule TAKE 1 CAPSULE (40 MG TOTAL) BY MOUTH DAILY.INS ALLOWS 30 DAYS Patient taking differently: Take 40 mg by mouth daily.  10/25/16  Yes Plotnikov, Evie Lacks, MD  metFORMIN (GLUCOPHAGE) 500 MG tablet Take 1 tablet (500 mg total) by mouth 3 (three) times daily. 01/12/19 01/12/20 Yes Plotnikov, Evie Lacks, MD  metoprolol succinate (TOPROL-XL) 50 MG 24 hr tablet Take 1 tablet (50 mg total) by mouth daily. 09/12/18 09/12/19 Yes Josue Hector, MD  Multiple Vitamin (MULTIVITAMIN  WITH MINERALS) TABS tablet Take 1 tablet by mouth daily. Ultra Vital Gold   Yes [provider]  nitroGLYCERIN (NITROSTAT) 0.4 MG SL tablet Place 1 tablet (0.4 mg total) under the tongue every 5 (five) minutes as needed for chest pain. 09/12/18 01/21/19 Yes Josue Hector, MD  OVER THE COUNTER MEDICATION Take 1 capsule by mouth 2 (two) times a day. Weight Support   Yes [provider]  repaglinide (PRANDIN) 1 MG tablet Take 1 tablet (1 mg total) by mouth 3 (three) times daily before meals. 04/18/18  Yes Plotnikov, Evie Lacks, MD  rOPINIRole (REQUIP) 1 MG tablet TAKE 1 TABLET BY MOUTH AT BEDTIME AS NEEDED FOR RESTLESS LEGS Patient taking differently: Take 1 mg by mouth at bedtime.  07/13/18  Yes Plotnikov, Evie Lacks, MD   valsartan-hydrochlorothiazide (DIOVAN-HCT) 320-25 MG tablet Take 1 tablet by mouth daily. 01/28/19  Yes Plotnikov, Evie Lacks, MD     Review of Systems  Positive ROS: As above  All other systems have been reviewed and were otherwise negative with the exception of those mentioned in the HPI and as above.  Objective: Vital signs in last 24 hours: Temp:  [98.1 F (36.7 C)] 98.1 F (36.7 C) (08/06 1047) Pulse Rate:  [52] 52 (08/06 1047) Resp:  [20] 20 (08/06 1047) BP: (183)/(95) 183/95 (08/06 1047) SpO2:  [99 %] 99 % (08/06 1047) Weight:  [108.9 kg] 108.9 kg (08/06 1047) Estimated body mass index is 38.74 kg/m as calculated from the following:   Height as of this encounter: 5\' 6"  (1.676 m).   Weight as of this encounter: 108.9 kg.   General Appearance: Alert Head: Normocephalic, without obvious abnormality, atraumatic Eyes: PERRL, conjunctiva/corneas clear, EOM's intact,    Ears: Normal  Throat: Normal  Neck: Supple, Back: unremarkable Lungs: Clear to auscultation bilaterally, respirations unlabored Heart: Regular rate and rhythm, no murmur, rub or gallop Abdomen: Soft, non-tender Extremities: Extremities normal, atraumatic, no cyanosis or edema Skin: unremarkable  NEUROLOGIC:   Mental status: alert and oriented,Motor Exam - grossly normal Sensory Exam - grossly normal Reflexes:  Coordination - grossly normal Gait - grossly normal Balance - grossly normal Cranial Nerves: I: smell Not tested  II: visual acuity  OS: Normal  OD: Normal   II: visual fields Full to confrontation  II: pupils Equal, round, reactive to light  III,VII: ptosis None  III,IV,VI: extraocular muscles  Full ROM  V: mastication Normal  V: facial light touch sensation  Normal  V,VII: corneal reflex  Present  VII: facial muscle function - upper  Normal  VII: facial muscle function - lower Normal  VIII: hearing Not tested  IX: soft palate elevation  Normal  IX,X: gag reflex Present  XI: trapezius  strength  5/5  XI: sternocleidomastoid strength 5/5  XI: neck flexion strength  5/5  XII: tongue strength  Normal    Data Review Lab Results  Component Value Date   WBC 7.8 01/27/2019   HGB 14.2 01/27/2019   HCT 41.8 01/27/2019   MCV 93.5 01/27/2019   PLT 229 01/27/2019   Lab Results  Component Value Date   NA 138 01/27/2019   K 3.5 01/27/2019   CL 104 01/27/2019   CO2 24 01/27/2019   BUN 8 01/27/2019   CREATININE 1.06 01/27/2019   GLUCOSE 193 (H) 01/27/2019   No results found for: INR, PROTIME  Assessment/Plan: C5-6 and C6-7 spondylosis, stenosis, cervical radiculopathy, cervical myelopathy, cervicalgia: I have discussed the situation with the patient.  I  have reviewed the patient's MRI scan with him and pointed out the abnormalities.  We have discussed the various treatment options including surgery.  I have described the surgical treatment option of a C5-6 and C6-7 anterior cervical discectomy, fusion and plating.  I have shown him surgical models.  I have given him a surgical pamphlet.  We have discussed the risks, benefits, alternatives, expected postoperative course, and likelihood of achieving our goals with surgery.  I have answered all his questions.  He has decided to proceed with surgery.   Ophelia Charter 01/30/2019 12:33 PM

## 2019-01-31 ENCOUNTER — Encounter (HOSPITAL_COMMUNITY): Payer: Self-pay | Admitting: Neurosurgery

## 2019-01-31 DIAGNOSIS — M50022 Cervical disc disorder at C5-C6 level with myelopathy: Secondary | ICD-10-CM | POA: Diagnosis not present

## 2019-01-31 LAB — GLUCOSE, CAPILLARY
Glucose-Capillary: 257 mg/dL — ABNORMAL HIGH (ref 70–99)
Glucose-Capillary: 188 mg/dL — ABNORMAL HIGH (ref 70–99)

## 2019-01-31 MED ORDER — CYCLOBENZAPRINE HCL 10 MG PO TABS: 10.0000 mg | ORAL_TABLET | Freq: Three times a day (TID) | ORAL | 0 refills | Status: AC | PRN

## 2019-01-31 MED ORDER — OXYCODONE HCL 5 MG PO TABS: 5.0000 mg | ORAL_TABLET | ORAL | 0 refills | Status: AC | PRN

## 2019-01-31 MED ORDER — DOCUSATE SODIUM 100 MG PO CAPS: 100.0000 mg | ORAL_CAPSULE | Freq: Two times a day (BID) | ORAL | 0 refills | Status: DC

## 2019-01-31 NOTE — Evaluation (Signed)
Occupational Therapy Evaluation Patient Details Name: Jerry Cohen MRN: [id number] DOB: 06/04/1956 Today's Date: 01/31/2019    History of Present Illness 63 yo male s/p C5-6 C6- anterior cervical diskectomy fusion and plating on 01/30/19 PMH Ascending aortic aneurysm , Coronary artery calcification seen on CAT scan (07/19/2018), Diabetes mellitus without complication, Dizziness (2012), ED (erectile dysfunction), GERD (gastroesophageal reflux disease), colonic polyps, Hyperlipidemia, Hypertension, OSA on CPAP, and Osteoarthritis.    Clinical Impression   Patient evaluated by Occupational Therapy with no further acute OT needs identified. All education has been completed and the patient has no further questions. See below for any follow-up Occupational Therapy or equipment needs. OT to sign off. Thank you for referral.      Follow Up Recommendations  No OT follow up    Equipment Recommendations  None recommended by OT    Recommendations for Other Services       Precautions / Restrictions Precautions Precautions: Cervical Required Braces or Orthoses: Cervical Brace Cervical Brace: Hard collar;At all times Restrictions Weight Bearing Restrictions: No      Mobility Bed Mobility Overal bed mobility: Needs Assistance Bed Mobility: Supine to Sit     Supine to sit: Supervision     General bed mobility comments: min cues for sequence and increased time to complete  Transfers Overall transfer level: Modified independent                    Balance                                           ADL either performed or assessed with clinical judgement   ADL Overall ADL's : Modified independent                                       General ADL Comments: completed dressing this session. educated on cervical collar in the bathroom standing. pt needed cues mod during session for neck rotation  Cervical precautions ( handout provided):  Educated patient on don doff brace with return demonstration, educated on oral care using cups, washing face with cloth, never to wash directly on incision site, avoid neck rotation flexion and extension, positioning with pillows in chair for bil UE, sleeping positioning, avoiding pushing / pulling with bil UE  Pt educated on need to notify doctor / RN of swallowing changes or choking..     Vision Baseline Vision/History: Wears glasses Wears Glasses: At all times       Perception     Praxis      Pertinent Vitals/Pain Pain Assessment: No/denies pain     Hand Dominance Left   Extremity/Trunk Assessment Upper Extremity Assessment Upper Extremity Assessment: RUE deficits/detail;LUE deficits/detail RUE Deficits / Details: numbness of thumb and 2nd digits and dorsal surface of hand only. pt also reports numbness and tingling along the dorsal internal aspect of forarm. pt without any fine motor deficits at this time. reports pain at the tricep with bed mobility.   LUE Deficits / Details: numbness of thumb and 2nd digits and dorsal surface of hand only. pt also reports numbness and tingling along the dorsal internal aspect of forarm. pt without any fine motor deficits at this time.   Lower Extremity Assessment Lower Extremity Assessment: Overall WFL for tasks assessed   Cervical /  Trunk Assessment Cervical / Trunk Assessment: Other exceptions(s/p surg)   Communication Communication Communication: No difficulties   Cognition Arousal/Alertness: Awake/alert Behavior During Therapy: WFL for tasks assessed/performed Overall Cognitive Status: Within Functional Limits for tasks assessed                                     General Comments  dressing dry and intact    Exercises     Shoulder Instructions      Home Living Family/patient expects to be discharged to:: Private residence Living Arrangements: Spouse/significant other Available Help at Discharge: Family Type  of Home: House Home Access: Stairs to enter Technical brewer of Steps: 1   Home Layout: Two level;Able to live on main level with bedroom/bathroom     Bathroom Shower/Tub: Occupational psychologist: Standard     Home Equipment: None          Prior Functioning/Environment Level of Independence: Independent                 OT Problem List:        OT Treatment/Interventions:      OT Goals(Current goals can be found in the care plan section) Acute Rehab OT Goals Potential to Achieve Goals: Good  OT Frequency:     Barriers to D/C:            Co-evaluation              AM-PAC OT "6 Clicks" Daily Activity     Outcome Measure Help from another person eating meals?: None Help from another person taking care of personal grooming?: None Help from another person toileting, which includes using toliet, bedpan, or urinal?: None Help from another person bathing (including washing, rinsing, drying)?: None Help from another person to put on and taking off regular upper body clothing?: None Help from another person to put on and taking off regular lower body clothing?: None 6 Click Score: 24   End of Session Equipment Utilized During Treatment: Cervical collar Nurse Communication: Mobility status;Precautions;Weight bearing status  Activity Tolerance: Patient tolerated treatment well Patient left: in chair;with call bell/phone within reach;with nursing/sitter in room  OT Visit Diagnosis: Unsteadiness on feet (R26.81)                Time: 2956-2130 OT Time Calculation (min): 17 min Charges:  OT General Charges $OT Visit: 1 Visit OT Evaluation $OT Eval Moderate Complexity: 1 Mod   Jeri Modena, OTR/L  Acute Rehabilitation Services Pager: [phone] Office: [phone] .   Jeri Modena 01/31/2019, 11:00 AM

## 2019-01-31 NOTE — Discharge Summary (Signed)
Physician Discharge Summary Patient ID: Jerry Cohen MRN: [id number] DOB/AGE: 1955/11/13 63 y.o.  Admit date: 01/30/2019 Discharge date: 01/31/2019  Admission Diagnoses: cervical spondylosis, cervical stenosis, cervical myelopathy, cervicalgia, cervical radiculopathy  Discharge Diagnoses:  The same Active Problems:   Cervical spondylosis with myelopathy and radiculopathy   Discharged Condition: good  Hospital Course:  I performed a C5-6 and C6-7 anterior cervical diskectomy, fusion and plating on the patient on 01/30/2019.  The patient's postoperative course was unremarkable.  On postoperative day 1. He requested discharge to home.  He was given written in oral discharge instructions.  All his questions were answered.  Consults:  Occupational therapy Significant Diagnostic Studies: none Treatments: C5-6 and C6-7 anterior cervical diskectomy, fusion and plating Discharge Exam: Blood pressure 121/72, pulse (!) 57, temperature 97.6 F (36.4 C), temperature source Oral, resp. rate 20, height 5\' 6"  (1.676 m), weight 108.9 kg, SpO2 95 %.  the patient is alert and pleasant.  He looks well.  His strength is normal.  His dressing is clean and dry.  There is no hematoma or shift.  Disposition:   Home  Discharge Instructions   Call MD for:  difficulty breathing, headache or visual disturbances   Complete by: As directed   Call MD for:  extreme fatigue   Complete by: As directed   Call MD for:  hives   Complete by: As directed   Call MD for:  persistant dizziness or light-headedness   Complete by: As directed   Call MD for:  persistant nausea and vomiting   Complete by: As directed   Call MD for:  redness, tenderness, or signs of infection (pain, swelling, redness, odor or green/yellow discharge around incision site)   Complete by: As directed   Call MD for:  severe uncontrolled pain   Complete by: As directed   Call MD for:  temperature >100.4   Complete by: As directed   Diet - low  sodium heart healthy   Complete by: As directed   Discharge instructions   Complete by: As directed   Call [phone] for a followup appointment. Take a stool softener while you are using pain medications.  Driving Restrictions   Complete by: As directed   Do not drive for 2 weeks.  Increase activity slowly   Complete by: As directed   Lifting restrictions   Complete by: As directed   Do not lift more than 5 pounds. No excessive bending or twisting.  May shower / Bathe   Complete by: As directed   Remove the dressing for 3 days after surgery.  You may shower, but leave the incision alone.  Remove dressing in 48 hours   Complete by: As directed   Your stitches are under the scan and will dissolve by themselves. The Steri-Strips will fall off after you take a few showers. Do not rub back or pick at the wound, Leave the wound alone.   Allergies as of 01/31/2019  No Known Allergies   Medication List  TAKE these medications  aspirin 81 MG EC tablet Take 81 mg by mouth daily.  atorvastatin 80 MG tablet Commonly known as: LIPITOR Take 1 tablet (80 mg total) by mouth daily. What changed: when to take this  Cholecalciferol 25 MCG (1000 UT) tablet Commonly known as: EQL Vitamin D3 Take 2 tablets (2,000 Units total) by mouth daily.  cyclobenzaprine 10 MG tablet Commonly known as: FLEXERIL Take 1 tablet (10 mg total) by mouth 3 (three) times daily as needed  for muscle spasms.  docusate sodium 100 MG capsule Commonly known as: COLACE Take 1 capsule (100 mg total) by mouth 2 (two) times daily.  esomeprazole 40 MG capsule Commonly known as: NEXIUM TAKE 1 CAPSULE (40 MG TOTAL) BY MOUTH DAILY.INS ALLOWS 30 DAYS What changed: See the new instructions.  metFORMIN 500 MG tablet Commonly known as: Glucophage Take 1 tablet (500 mg total) by mouth 3 (three) times daily.  metoprolol succinate 50 MG 24 hr tablet Commonly known as: TOPROL-XL Take 1 tablet (50 mg total) by mouth  daily.  multivitamin with minerals Tabs tablet Take 1 tablet by mouth daily. Ultra Vital Gold  nitroGLYCERIN 0.4 MG SL tablet Commonly known as: NITROSTAT Place 1 tablet (0.4 mg total) under the tongue every 5 (five) minutes as needed for chest pain.  OVER THE COUNTER MEDICATION Take 1 capsule by mouth 2 (two) times a day. Weight Support  oxyCODONE 5 MG immediate release tablet Commonly known as: Oxy IR/ROXICODONE Take 1 tablet (5 mg total) by mouth every 4 (four) hours as needed for moderate pain ((score 4 to 6)).  repaglinide 1 MG tablet Commonly known as: Prandin Take 1 tablet (1 mg total) by mouth 3 (three) times daily before meals.  rOPINIRole 1 MG tablet Commonly known as: REQUIP TAKE 1 TABLET BY MOUTH AT BEDTIME AS NEEDED FOR RESTLESS LEGS What changed: See the new instructions.  valsartan-hydrochlorothiazide 320-25 MG tablet Commonly known as: DIOVAN-HCT Take 1 tablet by mouth daily.  VITAMIN B-12 PO Take 1 tablet by mouth daily.      Signed: Ophelia Charter 01/31/2019, 6:54 AM

## 2019-01-31 NOTE — Discharge Instructions (Signed)

## 2019-01-31 NOTE — Progress Notes (Signed)
Patient is discharged from room 3C09 at this time. Alert and in stable condition. IV site d/c'd and instructions read to patient and spouse with understanding verbalized. Left unit via wheelchair with all belongings at side. 

## 2019-02-18 ENCOUNTER — Encounter (HOSPITAL_COMMUNITY): Payer: Self-pay | Admitting: Neurosurgery

## 2019-02-20 ENCOUNTER — Telehealth: Payer: Self-pay | Admitting: *Deleted

## 2019-02-20 NOTE — Telephone Encounter (Signed)
Call placed to pt to switch to virtual visit.  Pt agreed but didn't have his medication list with him to go over meds.  Instructions on the virtual visit given to pt.      Virtual Visit Pre-Appointment Phone Call  "(Name), I am calling you today to discuss your upcoming appointment. We are currently trying to limit exposure to the virus that causes COVID-19 by seeing patients at home rather than in the office."  1. "What is the BEST phone number to call the day of the visit?" - include this in appointment notes  2. "Do you have or have access to (through a family member/friend) a smartphone with video capability that we can use for your visit?" a. If yes - list this number in appt notes as "cell" (if different from BEST phone #) and list the appointment type as a VIDEO visit in appointment notes b. If no - list the appointment type as a PHONE visit in appointment notes  3. Confirm consent - "In the setting of the current Covid19 crisis, you are scheduled for a (phone or video) visit with your provider on (date) at (time).  Just as we do with many in-office visits, in order for you to participate in this visit, we must obtain consent.  If you'd like, I can send this to your mychart (if signed up) or email for you to review.  Otherwise, I can obtain your verbal consent now.  All virtual visits are billed to your insurance company just like a normal visit would be.  By agreeing to a virtual visit, we'd like you to understand that the technology does not allow for your provider to perform an examination, and thus may limit your provider's ability to fully assess your condition. If your provider identifies any concerns that need to be evaluated in person, we will make arrangements to do so.  Finally, though the technology is pretty good, we cannot assure that it will always work on either your or our end, and in the setting of a video visit, we may have to convert it to a phone-only visit.  In either  situation, we cannot ensure that we have a secure connection.  Are you willing to proceed?" STAFF: Did the patient verbally acknowledge consent to telehealth visit? Document YES/NO here: YES  4. Advise patient to be prepared - "Two hours prior to your appointment, go ahead and check your blood pressure, pulse, oxygen saturation, and your weight (if you have the equipment to check those) and write them all down. When your visit starts, your provider will ask you for this information. If you have an Apple Watch or Kardia device, please plan to have heart rate information ready on the day of your appointment. Please have a pen and paper handy nearby the day of the visit as well."  5. Give patient instructions for MyChart download to smartphone OR Doximity/Doxy.me as below if video visit (depending on what platform provider is using)  6. Inform patient they will receive a phone call 15 minutes prior to their appointment time (may be from unknown caller ID) so they should be prepared to answer    TELEPHONE CALL NOTE  Jerry Cohen has been deemed a candidate for a follow-up tele-health visit to limit community exposure during the Covid-19 pandemic. I spoke with the patient via phone to ensure availability of phone/video source, confirm preferred email & phone number, and discuss instructions and expectations.  I reminded Jerry Cohen to  be prepared with any vital sign and/or heart rhythm information that could potentially be obtained via home monitoring, at the time of his visit. I reminded Jerry Cohen to expect a phone call prior to his visit.  Jeanann Lewandowsky, [place] 02/20/2019 1:14 PM   INSTRUCTIONS FOR DOWNLOADING THE MYCHART APP TO SMARTPHONE  - The patient must first make sure to have activated MyChart and know their login information - If Apple, go to [employer] and type in MyChart in the search bar and download the app. If Android, ask patient to go to [employer] and type in  [place] in the search bar and download the app. The app is free but as with any other app downloads, their phone may require them to verify saved payment information or Apple/Android password.  - The patient will need to then log into the app with their MyChart username and password, and select [facility] as their healthcare provider to link the account. When it is time for your visit, go to the MyChart app, find appointments, and click Begin Video Visit. Be sure to Select Allow for your device to access the Microphone and Camera for your visit. You will then be connected, and your provider will be with you shortly.  **If they have any issues connecting, or need assistance please contact MyChart service desk [phone])**  **If using a computer, in order to ensure the best quality for their visit they will need to use either of the following Internet Browsers: [employer], or Google Chrome**  IF USING DOXIMITY or DOXY.ME - The patient will receive a link just prior to their visit by text.     FULL LENGTH CONSENT FOR TELE-HEALTH VISIT   I hereby voluntarily request, consent and authorize [facility] and its employed or contracted physicians, physician assistants, nurse practitioners or other licensed health care professionals (the Practitioner), to provide me with telemedicine health care services (the "Services") as deemed necessary by the treating Practitioner. I acknowledge and consent to receive the Services by the Practitioner via telemedicine. I understand that the telemedicine visit will involve communicating with the Practitioner through live audiovisual communication technology and the disclosure of certain medical information by electronic transmission. I acknowledge that I have been given the opportunity to request an in-person assessment or other available alternative prior to the telemedicine visit and am voluntarily participating in the telemedicine visit.  I  understand that I have the right to withhold or withdraw my consent to the use of telemedicine in the course of my care at any time, without affecting my right to future care or treatment, and that the Practitioner or I may terminate the telemedicine visit at any time. I understand that I have the right to inspect all information obtained and/or recorded in the course of the telemedicine visit and may receive copies of available information for a reasonable fee.  I understand that some of the potential risks of receiving the Services via telemedicine include:  Marland Kitchen Delay or interruption in medical evaluation due to technological equipment failure or disruption; . Information transmitted may not be sufficient (e.g. poor resolution of images) to allow for appropriate medical decision making by the Practitioner; and/or  . In rare instances, security protocols could fail, causing a breach of personal health information.  Furthermore, I acknowledge that it is my responsibility to provide information about my medical history, conditions and care that is complete and accurate to the best of my ability. I acknowledge that Practitioner's  advice, recommendations, and/or decision may be based on factors not within their control, such as incomplete or inaccurate data provided by me or distortions of diagnostic images or specimens that may result from electronic transmissions. I understand that the practice of medicine is not an exact science and that Practitioner makes no warranties or guarantees regarding treatment outcomes. I acknowledge that I will receive a copy of this consent concurrently upon execution via email to the email address I last provided but may also request a printed copy by calling the office of [facility].    I understand that my insurance will be billed for this visit.   I have read or had this consent read to me. . I understand the contents of this consent, which adequately explains the  benefits and risks of the Services being provided via telemedicine.  . I have been provided ample opportunity to ask questions regarding this consent and the Services and have had my questions answered to my satisfaction. . I give my informed consent for the services to be provided through the use of telemedicine in my medical care  By participating in this telemedicine visit I agree to the above.

## 2019-02-20 NOTE — Progress Notes (Signed)
Virtual Visit via Video Note   This visit type was conducted due to national recommendations for restrictions regarding the COVID-19 Pandemic (e.g. social distancing) in an effort to limit this patient's exposure and mitigate transmission in our community.  Due to his co-morbid illnesses, this patient is at least at moderate risk for complications without adequate follow up.  This format is felt to be most appropriate for this patient at this time.  All issues noted in this document were discussed and addressed.  A limited physical exam was performed with this format.  Please refer to the patient's chart for his consent to telehealth for [facility].   Evaluation Performed:  Follow-up visit  This visit type was conducted due to national recommendations for restrictions regarding the COVID-19 Pandemic (e.g. social distancing).  This format is felt to be most appropriate for this patient at this time.  All issues noted in this document were discussed and addressed.  No physical exam was performed (except for noted visual exam findings with Video Visits).  Please refer to the patient's chart (MyChart message for video visits and phone note for telephone visits) for the patient's consent to telehealth for [facility].  Date:  02/21/2019   ID:  Jerry Cohen, DOB [birth year], MRN [id number]  Patient Location:  Home  Provider location:   [place]  PCP:  Plotnikov, Evie Lacks, MD  Cardiologist:  Fransico Him, MD  Electrophysiologist:  None   Chief Complaint:  OSA  History of Present Illness:    Jerry Cohen is a 63 y.o. male who presents via audio/video conferencing for a telehealth visit today.    This is 64yo male with a history of obstructive sleep apnea on CPAP, hypertension, hyperlipidemia, poorly controlled DM and GERD.  Had screening calcium score ordered by primary and was  elevated at 284 which was 51 percentile for age and sex matched controls.  He was also noted to have a  moderately dilated ascending aortic aneurysm at 46 mm.    He is here today for followup and is doing well.  Since I saw him last he has had neck surgery and is in a neck brace. He denies any chest pain or pressure, SOB, DOE, PND, orthopnea, dizziness, palpitations or syncope. He occasionally has some mild LE edema from time to time.  He is compliant with his meds and is tolerating meds with no SE.  He is doing well with his CPAP device and thinks that he has gotten used to it.  He tolerates the mask and feels the pressure is adequate.  Since going on CPAP he feels rested in the am and has no significant daytime sleepiness.  He denies any significant mouth or nasal dryness or nasal congestion.  In the am he notices his mouth is dry but then resolves.  He does not think that he snores.    The patient does not have symptoms concerning for COVID-19 infection (fever, chills, cough, or new shortness of breath).    Prior CV studies:   The following studies were reviewed today:  none  Past Medical History:  Diagnosis Date  . Ascending aortic aneurysm ([place]) 07/19/2018  . CAD (coronary artery disease), native coronary artery 07/19/2018   Cath 08/2018 showed 40% oD1, 70% D2, 60-70% mLCx and 90% pPDA (RCA codominant).  . Diabetes mellitus without complication ([place])   . Dizziness 2012  . ED (erectile dysfunction)   . GERD (gastroesophageal reflux disease)   . Hx of colonic  polyps    Dr Fuller Plan  . Hyperlipidemia   . Hypertension   . OSA on CPAP   . Osteoarthritis    Past Surgical History:  Procedure Laterality Date  . ANTERIOR CERVICAL DECOMP/DISCECTOMY FUSION N/A 01/30/2019   Procedure: ANTERIOR CERVICAL DECOMPRESSION/DISCECTOMY FUSION, INTERBODY PROSTHESIS, PLATE/SCREWS CERVICAL FIVE CERVICAL SIX, CERVICAL SIX- CERVICAL SEVEN;  Surgeon: Newman Pies, MD;  Location: [place];  Service: Neurosurgery;  Laterality: N/A;  . APPENDECTOMY  1996  . COLONOSCOPY  2015  . KNEE ARTHROSCOPY Left 2009  . LASIK  Bilateral 2007  . LEFT HEART CATH AND CORONARY ANGIOGRAPHY N/A 09/02/2018   Procedure: LEFT HEART CATH AND CORONARY ANGIOGRAPHY;  Surgeon: Belva Crome, MD;  Location: [place] [facility];  Service: Cardiovascular;  Laterality: N/A;  . SHOULDER ARTHROSCOPY W/ ROTATOR CUFF REPAIR Left 2010     Current Meds  Medication Sig  . aspirin 81 MG EC tablet Take 81 mg by mouth daily.    Marland Kitchen atorvastatin (LIPITOR) 80 MG tablet Take 1 tablet (80 mg total) by mouth daily. (Patient taking differently: Take 80 mg by mouth every evening. )  . Cholecalciferol (EQL VITAMIN D3) 25 MCG (1000 UT) tablet Take 2 tablets (2,000 Units total) by mouth daily.  . Cyanocobalamin (VITAMIN B-12 PO) Take 1 tablet by mouth daily.  . cyclobenzaprine (FLEXERIL) 10 MG tablet Take 1 tablet (10 mg total) by mouth 3 (three) times daily as needed for muscle spasms.  Marland Kitchen docusate sodium (COLACE) 100 MG capsule Take 1 capsule (100 mg total) by mouth 2 (two) times daily.  Marland Kitchen esomeprazole (NEXIUM) 40 MG capsule TAKE 1 CAPSULE (40 MG TOTAL) BY MOUTH DAILY.INS ALLOWS 30 DAYS (Patient taking differently: Take 40 mg by mouth daily. )  . metFORMIN (GLUCOPHAGE) 500 MG tablet Take 1 tablet (500 mg total) by mouth 3 (three) times daily.  . metoprolol succinate (TOPROL-XL) 50 MG 24 hr tablet Take 1 tablet (50 mg total) by mouth daily.  . Multiple Vitamin (MULTIVITAMIN WITH MINERALS) TABS tablet Take 1 tablet by mouth daily. Ultra Vital Gold  . oxyCODONE (OXY IR/ROXICODONE) 5 MG immediate release tablet Take 1 tablet (5 mg total) by mouth every 4 (four) hours as needed for moderate pain ((score 4 to 6)).  . repaglinide (PRANDIN) 1 MG tablet Take 1 tablet (1 mg total) by mouth 3 (three) times daily before meals.  Marland Kitchen rOPINIRole (REQUIP) 1 MG tablet TAKE 1 TABLET BY MOUTH AT BEDTIME AS NEEDED FOR RESTLESS LEGS (Patient taking differently: Take 1 mg by mouth at bedtime. )  . valsartan-hydrochlorothiazide (DIOVAN-HCT) 320-25 MG tablet Take 1 tablet by mouth  daily.     Allergies:   Patient has no known allergies.   Social History   Tobacco Use  . Smoking status: Current Some Day Smoker    Types: Cigars  . Smokeless tobacco: Never Used  . Tobacco comment: quit smoking 30 years ago.  Substance Use Topics  . Alcohol use: Yes    Alcohol/week: 0.0 standard drinks    Comment: about 2 times per month  . Drug use: No     Family Hx: The patient's family history includes Cancer in his father, sister, and another family member; Colon cancer (age of onset: 3) in an other family member; Colon cancer (age of onset: 54) in his father and sister; Colon cancer (age of onset: 43) in his sister; Mental illness in his mother. There is no history of Coronary artery disease.  ROS:   Please see the  history of present illness.     All other systems reviewed and are negative.   Labs/Other Tests and Data Reviewed:    Recent Labs: 06/03/2018: ALT 97; TSH 0.99 01/27/2019: BUN 8; Creatinine, Ser 1.06; Hemoglobin 14.2; Platelets 229; Potassium 3.5; Sodium 138   Recent Lipid Panel Lab Results  Component Value Date/Time   CHOL 129 09/20/2018 07:30 AM   TRIG 189 (H) 09/20/2018 07:30 AM   HDL 27 (L) 09/20/2018 07:30 AM   CHOLHDL 4.8 09/20/2018 07:30 AM   CHOLHDL 7 06/03/2018 10:08 AM   LDLCALC 64 09/20/2018 07:30 AM   LDLDIRECT 143.0 06/03/2018 10:08 AM    Wt Readings from Last 3 Encounters:  02/21/19 224 lb (101.6 kg)  01/30/19 240 lb (108.9 kg)  01/27/19 241 lb 14.4 oz (109.7 kg)     Objective:    Vital Signs:  BP (!) 146/105   Pulse 96   Ht 5\' 6"  (1.676 m)   Wt 224 lb (101.6 kg)   BMI 36.15 kg/m    CONSTITUTIONAL:  Well nourished, well developed male in no acute distress.  EYES: anicteric MOUTH: oral mucosa is pink RESPIRATORY: Normal respiratory effort, symmetric expansion CARDIOVASCULAR: No peripheral SKIN: No rash, lesions or ulcers MUSCULOSKELETAL: no digital cyanosis NEURO: Cranial Nerves II-XII grossly intact, moves all  extremities PSYCH: Intact judgement and insight.  A&O x 3, Mood/affect appropriateedema    ASSESSMENT & PLAN:    1.  CAD  - Cath 08/2018 showed 40% oD1, 70% D2, 60-70% mLCx and 90% pPDA (RCA codominant). He has not had any anginal sx.  He will continue on Toprol to 50 mg daily, high dose statin and ASA 81mg  daily.    2.  Hypertension -His BP is elevated this am but has just taken his BP med.    He will continue onDiovan HCT 320-25mg  daily.  I have instructed him to increase Toprol to 75mg  daily.  Check BP daily around lunch for a week and call with results. Creatinine was stable at 1.06 earlier this month.   3.  Hyperlipidemia -his LDL goal is less than 70.  His LDL was 64 on 08/2018.  He will continue on atorvastatin 80mg  daily.    4.  Ascending aortic aneurysm -4.4 cm on gated CT f/u CT March 2021   5. DM:  Poorly controlled last A1c 04/18/18 8.6 but improved to 7.4 earlier this month.  This is followed by his PCP.  Continue on Metformin 500mg  TID.     6.  OSA -  I will get a download from the DME. The patient has been using and benefiting from PAP use and will continue to benefit from therapy.    COVID-19 Education: The signs and symptoms of COVID-19 were discussed with the patient and how to seek care for testing (follow up with PCP or arrange E-visit).  The importance of social distancing was discussed today.  Patient Risk:   After full review of this patient's clinical status, I feel that they are at least moderate risk at this time.  Time:   Today, I have spent 20 minutes on telemedicine discussing medical problems including CAD, HTN, HLD, OSA.  We also reviewed the symptoms of COVID 19 and the ways to protect against contracting the virus with telehealth technology.  I spent an additional 5 minutes reviewing patient's chart including cath, chest CTA.  Medication Adjustments/Labs and Tests Ordered: Current medicines are reviewed at length with the patient today.  Concerns  regarding medicines are outlined  above.  Tests Ordered: No orders of the defined types were placed in this encounter.  Medication Changes: No orders of the defined types were placed in this encounter.   Disposition:  Follow up 1 year with me.  Signed, Fransico Him, MD  02/21/2019 8:13 AM    [place]

## 2019-02-21 ENCOUNTER — Other Ambulatory Visit: Payer: Self-pay

## 2019-02-21 ENCOUNTER — Encounter: Payer: Self-pay | Admitting: Cardiology

## 2019-02-21 ENCOUNTER — Telehealth (INDEPENDENT_AMBULATORY_CARE_PROVIDER_SITE_OTHER): Payer: Commercial Managed Care - PPO | Admitting: Cardiology

## 2019-02-21 VITALS — BP 146/105 | HR 96 | Ht 66.0 in | Wt 224.0 lb

## 2019-02-21 DIAGNOSIS — I7121 Aneurysm of the ascending aorta, without rupture: Secondary | ICD-10-CM

## 2019-02-21 DIAGNOSIS — I712 Thoracic aortic aneurysm, without rupture, unspecified: Secondary | ICD-10-CM

## 2019-02-21 DIAGNOSIS — G4733 Obstructive sleep apnea (adult) (pediatric): Secondary | ICD-10-CM

## 2019-02-21 DIAGNOSIS — I2510 Atherosclerotic heart disease of native coronary artery without angina pectoris: Secondary | ICD-10-CM

## 2019-02-21 DIAGNOSIS — E785 Hyperlipidemia, unspecified: Secondary | ICD-10-CM | POA: Diagnosis not present

## 2019-02-21 DIAGNOSIS — I10 Essential (primary) hypertension: Secondary | ICD-10-CM | POA: Diagnosis not present

## 2019-02-21 DIAGNOSIS — E1165 Type 2 diabetes mellitus with hyperglycemia: Secondary | ICD-10-CM

## 2019-02-21 MED ORDER — METOPROLOL SUCCINATE ER 25 MG PO TB24: 25.0000 mg | ORAL_TABLET | Freq: Every day | ORAL | 3 refills | Status: DC

## 2019-02-21 NOTE — Patient Instructions (Addendum)
Medication Instructions:  Please increase your Metoprolol succinate to 75 mg daily.  You will need to take (1) 25 mg tablet and (1) 50 mg tablet every morning.  A new script for the 25 mg tablets were sent into you pharmacy.   Please call with your blood pressure reading in 1 week. Continue all other medications as listed.  If you need a refill on your cardiac medications before your next appointment, please call your pharmacy.   Testing/Procedures: Chest CT Angiography (CTA), is a special type of CT scan that uses a computer to produce multi-dimensional views of major blood vessels throughout the body. In CT angiography, a contrast material is injected through an IV to help visualize the blood vessels.  This testing has been ordered for you to be completed in March 2021.  You will be contacted by our office at a later date to be scheduled.  Follow-Up: Follow up in 1 year with Dr. Radford Pax.  You will receive a letter in the mail 2 months before you are due.  Please call us when you receive this letter to schedule your follow up appointment.  Thank you for choosing [facility]!!

## 2019-02-24 ENCOUNTER — Telehealth: Payer: Self-pay | Admitting: *Deleted

## 2019-02-24 NOTE — Telephone Encounter (Signed)
Informed patient of compliance results and patient understanding was verbalized. Patient is aware and agreeable to AHI being within range at 2.8. Patient is aware and agreeable to being in compliance with machine usage. Patient is aware and agreeable to no change in current pressures.

## 2019-02-24 NOTE — Telephone Encounter (Signed)
-----   Message from Sueanne Margarita, MD sent at 02/24/2019 12:05 PM EDT ----- Regarding: RE: Requested DOWNLOAD Download looks good Traci ----- Message ----- From: Freada Bergeron, CMA Sent: 02/24/2019  11:47 AM EDT To: Sueanne Margarita, MD Subject: Requested DOWNLOAD                             Hogrefe, Jaqualin 01/25/2019 - 02/23/2019 Patient ID: [id number] DOB: December 10, 1955 Age: 63 years 30.5 [place] (Therapist) [place], [id number] Compliance Report Usage 01/25/2019 - 02/23/2019 Usage days 26/30 days (87%) >= 4 hours 15 days (50%) < 4 hours 11 days (37%) Usage hours 130 hours 57 minutes Average usage (total days) 4 hours 22 minutes Average usage (days used) 5 hours 2 minutes Median usage (days used) 4 hours 10 minutes Total used hours (value since last reset - 02/23/2019) 3,458 hours AirSense 10 CPAP Serial number [id number] Mode CPAP Set pressure 11 cmH2O EPR Off EPR level 1 Therapy Leaks - L/min 95th percentile: 65.0 Events per hour AHI: 2.8 Usage - hours Printed on 02/24/2019 - ResMed AirView version 4.19.3-2.0 Page 1 of 1  ----- Message ----- From: Shellia Cleverly, RN Sent: 02/21/2019   8:22 AM EDT To: Freada Bergeron, CMA  Dr Radford Pax is wanting this PAP download from DME.  Can you get that and sent it to her please?  Thank you,  Pam ----- Message ----- From: Sueanne Margarita, MD Sent: 02/21/2019   8:02 AM EDT To: Shellia Cleverly, RN  Get PAP download from DME.  Increase Toprol XL to 75mg  daily and check BP daily for a week and call with results. Chest CT angio 08/2019 for ascending aortic aneurysm.  Followup with me in 1 year

## 2019-04-21 ENCOUNTER — Other Ambulatory Visit: Payer: Self-pay | Admitting: Internal Medicine

## 2019-05-20 ENCOUNTER — Encounter: Payer: Self-pay | Admitting: Internal Medicine

## 2019-05-20 ENCOUNTER — Other Ambulatory Visit: Payer: Self-pay

## 2019-05-20 ENCOUNTER — Other Ambulatory Visit (INDEPENDENT_AMBULATORY_CARE_PROVIDER_SITE_OTHER): Payer: Commercial Managed Care - PPO

## 2019-05-20 ENCOUNTER — Ambulatory Visit (INDEPENDENT_AMBULATORY_CARE_PROVIDER_SITE_OTHER): Payer: Commercial Managed Care - PPO | Admitting: Internal Medicine

## 2019-05-20 VITALS — BP 126/74 | HR 57 | Temp 98.0°F | Ht 66.0 in | Wt 236.0 lb

## 2019-05-20 DIAGNOSIS — D126 Benign neoplasm of colon, unspecified: Secondary | ICD-10-CM | POA: Diagnosis not present

## 2019-05-20 DIAGNOSIS — M4722 Other spondylosis with radiculopathy, cervical region: Secondary | ICD-10-CM

## 2019-05-20 DIAGNOSIS — K635 Polyp of colon: Secondary | ICD-10-CM | POA: Insufficient documentation

## 2019-05-20 DIAGNOSIS — K921 Melena: Secondary | ICD-10-CM

## 2019-05-20 DIAGNOSIS — M5412 Radiculopathy, cervical region: Secondary | ICD-10-CM

## 2019-05-20 DIAGNOSIS — I2510 Atherosclerotic heart disease of native coronary artery without angina pectoris: Secondary | ICD-10-CM | POA: Diagnosis not present

## 2019-05-20 DIAGNOSIS — M4712 Other spondylosis with myelopathy, cervical region: Secondary | ICD-10-CM | POA: Diagnosis not present

## 2019-05-20 LAB — PROTIME-INR
INR: 1 ratio (ref 0.8–1.0)
Prothrombin Time: 12 s (ref 9.6–13.1)

## 2019-05-20 LAB — CBC WITH DIFFERENTIAL/PLATELET
Basophils Absolute: 0.1 10*3/uL (ref 0.0–0.1)
Basophils Relative: 0.8 % (ref 0.0–3.0)
Eosinophils Absolute: 0.3 10*3/uL (ref 0.0–0.7)
Eosinophils Relative: 3.7 % (ref 0.0–5.0)
HCT: 39.7 % (ref 39.0–52.0)
Hemoglobin: 13.7 g/dL (ref 13.0–17.0)
Lymphocytes Relative: 26.2 % (ref 12.0–46.0)
Lymphs Abs: 2.2 10*3/uL (ref 0.7–4.0)
MCHC: 34.6 g/dL (ref 30.0–36.0)
MCV: 90.7 fl (ref 78.0–100.0)
Monocytes Absolute: 0.8 10*3/uL (ref 0.1–1.0)
Monocytes Relative: 9.4 % (ref 3.0–12.0)
Neutro Abs: 5 10*3/uL (ref 1.4–7.7)
Neutrophils Relative %: 59.9 % (ref 43.0–77.0)
Platelets: 250 10*3/uL (ref 150.0–400.0)
RBC: 4.38 Mil/uL (ref 4.22–5.81)
RDW: 14.5 % (ref 11.5–15.5)
WBC: 8.4 10*3/uL (ref 4.0–10.5)

## 2019-05-20 NOTE — Progress Notes (Signed)
Subjective:  Patient ID: Jerry Cohen, male    DOB: 04/20/1956  Age: 63 y.o. MRN: [id number]  CC: No chief complaint on file.   HPI AQUILA MAHI presents for blood in stool x2 on Sun No pain  Outpatient Medications Prior to Visit  Medication Sig Dispense Refill  . aspirin 81 MG EC tablet Take 81 mg by mouth daily.      Marland Kitchen atorvastatin (LIPITOR) 80 MG tablet Take 1 tablet (80 mg total) by mouth daily. (Patient taking differently: Take 80 mg by mouth every evening. ) 90 tablet 3  . Cholecalciferol (EQL VITAMIN D3) 25 MCG (1000 UT) tablet Take 2 tablets (2,000 Units total) by mouth daily. 100 tablet 6  . Cyanocobalamin (VITAMIN B-12 PO) Take 1 tablet by mouth daily.    . cyclobenzaprine (FLEXERIL) 10 MG tablet Take 1 tablet (10 mg total) by mouth 3 (three) times daily as needed for muscle spasms. 50 tablet 0  . esomeprazole (NEXIUM) 40 MG capsule TAKE 1 CAPSULE (40 MG TOTAL) BY MOUTH DAILY.INS ALLOWS 30 DAYS (Patient taking differently: Take 40 mg by mouth daily. ) 90 capsule 1  . metFORMIN (GLUCOPHAGE) 500 MG tablet Take 1 tablet (500 mg total) by mouth 3 (three) times daily. 270 tablet 3  . metoprolol succinate (TOPROL-XL) 25 MG 24 hr tablet Take 1 tablet (25 mg total) by mouth daily. Take (1) 25 mg tablet every morning along with (1) 50 mg tablet for total of 75 mg daily 90 tablet 3  . metoprolol succinate (TOPROL-XL) 50 MG 24 hr tablet Take 1 tablet (50 mg total) by mouth daily. 90 tablet 3  . Multiple Vitamin (MULTIVITAMIN WITH MINERALS) TABS tablet Take 1 tablet by mouth daily. Ultra Vital Gold    . OVER THE COUNTER MEDICATION Take 1 capsule by mouth 2 (two) times a day. Weight Support    . oxyCODONE (OXY IR/ROXICODONE) 5 MG immediate release tablet Take 1 tablet (5 mg total) by mouth every 4 (four) hours as needed for moderate pain ((score 4 to 6)). 30 tablet 0  . repaglinide (PRANDIN) 1 MG tablet Take 1 tablet (1 mg total) by mouth 3 (three) times daily before meals. Office visit  is due 90 tablet 1  . rOPINIRole (REQUIP) 1 MG tablet TAKE 1 TABLET BY MOUTH AT BEDTIME AS NEEDED FOR RESTLESS LEGS (Patient taking differently: Take 1 mg by mouth at bedtime. ) 30 tablet 12  . valsartan-hydrochlorothiazide (DIOVAN-HCT) 320-25 MG tablet Take 1 tablet by mouth daily. 90 tablet 3  . nitroGLYCERIN (NITROSTAT) 0.4 MG SL tablet Place 1 tablet (0.4 mg total) under the tongue every 5 (five) minutes as needed for chest pain. 25 tablet 3  . docusate sodium (COLACE) 100 MG capsule Take 1 capsule (100 mg total) by mouth 2 (two) times daily. 60 capsule 0   No facility-administered medications prior to visit.     ROS: Review of Systems  Constitutional: Negative for appetite change, fatigue and unexpected weight change.  HENT: Negative for congestion, nosebleeds, sneezing, sore throat and trouble swallowing.   Eyes: Negative for itching and visual disturbance.  Respiratory: Negative for cough.   Cardiovascular: Negative for chest pain, palpitations and leg swelling.  Gastrointestinal: Positive for anal bleeding and blood in stool. Negative for abdominal distention, abdominal pain, constipation, diarrhea, nausea, rectal pain and vomiting.  Genitourinary: Negative for frequency and hematuria.  Musculoskeletal: Negative for back pain, gait problem, joint swelling and neck pain.  Skin: Negative for rash.  Neurological:  Negative for dizziness, tremors, speech difficulty, weakness and light-headedness.  Psychiatric/Behavioral: Negative for agitation, dysphoric mood and sleep disturbance. The patient is not nervous/anxious.     Objective:  BP 126/74 (BP Location: Left Arm, Patient Position: Sitting, Cuff Size: Large)   Pulse (!) 57   Temp 98 F (36.7 C) (Oral)   Ht 5\' 6"  (1.676 m)   Wt 236 lb (107 kg)   SpO2 96%   BMI 38.09 kg/m   BP Readings from Last 3 Encounters:  05/20/19 126/74  02/21/19 (!) 146/105  01/31/19 103/78    Wt Readings from Last 3 Encounters:  05/20/19 236 lb  (107 kg)  02/21/19 224 lb (101.6 kg)  01/30/19 240 lb (108.9 kg)    Physical Exam Constitutional:      General: He is not in acute distress.    Appearance: He is well-developed.     Comments: NAD  Eyes:     Conjunctiva/sclera: Conjunctivae normal.     Pupils: Pupils are equal, round, and reactive to light.  Neck:     Musculoskeletal: Normal range of motion.     Thyroid: No thyromegaly.     Vascular: No JVD.  Cardiovascular:     Rate and Rhythm: Normal rate and regular rhythm.     Heart sounds: Normal heart sounds. No murmur. No friction rub. No gallop.   Pulmonary:     Effort: Pulmonary effort is normal. No respiratory distress.     Breath sounds: Normal breath sounds. No wheezing or rales.  Chest:     Chest wall: No tenderness.  Abdominal:     General: Bowel sounds are normal. There is no distension.     Palpations: Abdomen is soft. There is no mass.     Tenderness: There is no abdominal tenderness. There is no guarding or rebound.  Genitourinary:    Prostate: Normal.     Rectum: Normal. Guaiac result negative.  Musculoskeletal: Normal range of motion.        General: No tenderness.  Lymphadenopathy:     Cervical: No cervical adenopathy.  Skin:    General: Skin is warm and dry.     Findings: No rash.  Neurological:     Mental Status: He is alert and oriented to person, place, and time.     Cranial Nerves: No cranial nerve deficit.     Motor: No abnormal muscle tone.     Coordination: Coordination normal.     Gait: Gait normal.     Deep Tendon Reflexes: Reflexes are normal and symmetric.  Psychiatric:        Behavior: Behavior normal.        Thought Content: Thought content normal.        Judgment: Judgment normal.     Lab Results  Component Value Date   WBC 7.8 01/27/2019   HGB 14.2 01/27/2019   HCT 41.8 01/27/2019   PLT 229 01/27/2019   GLUCOSE 193 (H) 01/27/2019   CHOL 129 09/20/2018   TRIG 189 (H) 09/20/2018   HDL 27 (L) 09/20/2018   LDLDIRECT 143.0  06/03/2018   LDLCALC 64 09/20/2018   ALT 97 (H) 06/03/2018   AST 51 (H) 06/03/2018   NA 138 01/27/2019   K 3.5 01/27/2019   CL 104 01/27/2019   CREATININE 1.06 01/27/2019   BUN 8 01/27/2019   CO2 24 01/27/2019   TSH 0.99 06/03/2018   PSA 0.25 06/03/2018   HGBA1C 7.4 (H) 01/27/2019    No results found.  Assessment &  Plan:   There are no diagnoses linked to this encounter.   No orders of the defined types were placed in this encounter.    Follow-up: No follow-ups on file.  Walker Kehr, MD

## 2019-05-20 NOTE — Assessment & Plan Note (Addendum)
?  etiology - poss diverticulosis related Due colon in 2019 - ref to GI CBC, INR Go to ER if re-occurred

## 2019-05-20 NOTE — Assessment & Plan Note (Signed)
S/p fusion 2020

## 2019-05-20 NOTE — Assessment & Plan Note (Signed)
S/p fusion 2020 No pain

## 2019-05-20 NOTE — Assessment & Plan Note (Signed)
Colon 2016 Dr Fuller Plan Due colon in 2019 - ref to GI

## 2019-05-20 NOTE — Assessment & Plan Note (Signed)
No angina 

## 2019-05-21 ENCOUNTER — Other Ambulatory Visit: Payer: Self-pay | Admitting: Internal Medicine

## 2019-05-21 ENCOUNTER — Telehealth: Payer: Self-pay | Admitting: Gastroenterology

## 2019-05-21 NOTE — Telephone Encounter (Signed)
Please schedule direct colonoscopy.

## 2019-05-21 NOTE — Telephone Encounter (Signed)
We got a referral for this patient to have a colonoscopy- the recall says he is not due until 12/2019. Spoke with patients wife and let her know when he was due and she stated that husband is having some bleeding and that colon cancer runs deep in his family. Both sisters had colon cancer. He is wanting to go ahead and have it done early. Is it okay for him to have colonoscopy done early? Or do I need to bring him in for an OV with Dr. Fuller Plan?

## 2019-05-21 NOTE — Telephone Encounter (Signed)
Message left for patient to call back and schedule colonoscopy.

## 2019-05-21 NOTE — Telephone Encounter (Signed)
Okay for direct early recall or do you want him to have an office visit for the rectal bleeding?

## 2019-05-26 ENCOUNTER — Encounter: Payer: Self-pay | Admitting: Gastroenterology

## 2019-06-05 ENCOUNTER — Encounter: Payer: Self-pay | Admitting: Gastroenterology

## 2019-06-05 ENCOUNTER — Telehealth: Payer: Self-pay | Admitting: *Deleted

## 2019-06-05 ENCOUNTER — Ambulatory Visit (AMBULATORY_SURGERY_CENTER): Payer: Commercial Managed Care - PPO | Admitting: *Deleted

## 2019-06-05 ENCOUNTER — Other Ambulatory Visit: Payer: Self-pay

## 2019-06-05 VITALS — Temp 96.8°F | Ht 66.0 in | Wt 234.0 lb

## 2019-06-05 DIAGNOSIS — K921 Melena: Secondary | ICD-10-CM

## 2019-06-05 DIAGNOSIS — Z1159 Encounter for screening for other viral diseases: Secondary | ICD-10-CM

## 2019-06-05 DIAGNOSIS — Z800 Family history of malignant neoplasm of digestive organs: Secondary | ICD-10-CM

## 2019-06-05 DIAGNOSIS — Z86010 Personal history of colonic polyps: Secondary | ICD-10-CM

## 2019-06-05 MED ORDER — SUPREP BOWEL PREP KIT 17.5-3.13-1.6 GM/177ML PO SOLN: 1.0000 | Freq: Once | ORAL | 0 refills | Status: AC

## 2019-06-05 NOTE — Progress Notes (Signed)
Wife in [place] with pt- in mask and temp 97.5  No egg or soy allergy known to patient  No issues with past sedation with any surgeries  or procedures, no intubation problems  No diet pills per patient No home 02 use per patient  No blood thinners per patient  Pt denies issues with constipation  No A fib or A flutter  EMMI video sent to pt's e mail  TE Dr Fuller Plan pt requesting egd with colon   Due to the COVID-19 pandemic we are asking patients to follow these guidelines. Please only bring one care partner. Please be aware that your care partner may wait in the car in the parking lot or if they feel like they will be too hot to wait in the car, they may wait in the lobby on the 4th floor. All care partners are required to wear a mask the entire time (we do not have any that we can provide them), they need to practice social distancing, and we will do a Covid check for all patient's and care partners when you arrive. Also we will check their temperature and your temperature. If the care partner waits in their car they need to stay in the parking lot the entire time and we will call them on their cell phone when the patient is ready for discharge so they can bring the car to the front of the building. Also all patient's will need to wear a mask into building.  Suprep $15 coupon

## 2019-06-05 NOTE — Telephone Encounter (Signed)
Pt made aware of Dr Fuller Plan stating we can add egd 12-24- pt to still arrive at 7 am for 8 am EGD/COLON as we discussed in PV today- pt verbalized understanding

## 2019-06-05 NOTE — Telephone Encounter (Signed)
Dr Fuller Plan,  I saw Jerry Cohen in [place] today - he is scheduled for a colon with you 12-24 for hematochezia and hx colon polyps- he has a [facility] in his father and 2 sisters- he told me at [facility] that he is having difficulty swallowing again- his pills  are getting stuck and he feels like his food is getting stuck at times- he has vomited from attempting to swallow and he cannot-  He will have to take multiple swallows of water with pills and occasionally meals.  He is asking if he can add EGD to colon 12-24- he is your 8 am Pt- the 830 am slot is blocked at your request it says. I explained to Jerry Pytel I would ask you and see- if not he is ok to schedule EGD at a different time  Thanks,Marie PV

## 2019-06-05 NOTE — Telephone Encounter (Signed)
Called pt to make him aware dr stark okay'd adding egd with dilation to his colon 12-24[facility]

## 2019-06-05 NOTE — Telephone Encounter (Signed)
OK to add on EGD/dilation for him in the blocked 0830 slot.

## 2019-06-16 ENCOUNTER — Other Ambulatory Visit: Payer: Self-pay | Admitting: Gastroenterology

## 2019-06-16 ENCOUNTER — Ambulatory Visit (INDEPENDENT_AMBULATORY_CARE_PROVIDER_SITE_OTHER): Payer: Commercial Managed Care - PPO

## 2019-06-16 DIAGNOSIS — Z1159 Encounter for screening for other viral diseases: Secondary | ICD-10-CM

## 2019-06-16 LAB — SARS CORONAVIRUS 2 (TAT 6-24 HRS): SARS Coronavirus 2: NEGATIVE

## 2019-06-19 ENCOUNTER — Encounter: Payer: Self-pay | Admitting: Gastroenterology

## 2019-06-19 ENCOUNTER — Other Ambulatory Visit: Payer: Self-pay

## 2019-06-19 ENCOUNTER — Ambulatory Visit (AMBULATORY_SURGERY_CENTER): Payer: Commercial Managed Care - PPO | Admitting: Gastroenterology

## 2019-06-19 VITALS — BP 117/62 | HR 62 | Temp 97.9°F | Resp 17 | Ht 66.0 in | Wt 234.0 lb

## 2019-06-19 DIAGNOSIS — R1319 Other dysphagia: Secondary | ICD-10-CM

## 2019-06-19 DIAGNOSIS — D123 Benign neoplasm of transverse colon: Secondary | ICD-10-CM

## 2019-06-19 DIAGNOSIS — Z800 Family history of malignant neoplasm of digestive organs: Secondary | ICD-10-CM

## 2019-06-19 DIAGNOSIS — Z86010 Personal history of colonic polyps: Secondary | ICD-10-CM

## 2019-06-19 DIAGNOSIS — K2100 Gastro-esophageal reflux disease with esophagitis, without bleeding: Secondary | ICD-10-CM | POA: Diagnosis not present

## 2019-06-19 DIAGNOSIS — K921 Melena: Secondary | ICD-10-CM | POA: Diagnosis present

## 2019-06-19 DIAGNOSIS — K449 Diaphragmatic hernia without obstruction or gangrene: Secondary | ICD-10-CM | POA: Diagnosis not present

## 2019-06-19 DIAGNOSIS — R1310 Dysphagia, unspecified: Secondary | ICD-10-CM

## 2019-06-19 DIAGNOSIS — K640 First degree hemorrhoids: Secondary | ICD-10-CM

## 2019-06-19 DIAGNOSIS — K5730 Diverticulosis of large intestine without perforation or abscess without bleeding: Secondary | ICD-10-CM | POA: Diagnosis not present

## 2019-06-19 DIAGNOSIS — K222 Esophageal obstruction: Secondary | ICD-10-CM | POA: Diagnosis not present

## 2019-06-19 MED ORDER — SODIUM CHLORIDE 0.9 % IV SOLN: 500.0000 mL | Freq: Once | INTRAVENOUS | Status: DC

## 2019-06-19 NOTE — Progress Notes (Signed)
Called to room to assist during endoscopic procedure.  Patient ID and intended procedure confirmed with present staff. Received instructions for my participation in the procedure from the performing physician.  

## 2019-06-19 NOTE — Progress Notes (Signed)
Temp by JB, VS by DT  Pt's states no medical or surgical changes since previsit or office visit. 

## 2019-06-19 NOTE — Op Note (Signed)
[name] Patient Name: Jerry Cohen Procedure Date: 06/19/2019 8:10 AM MRN: [id number] Endoscopist: Ladene Artist , MD Age: 63 Referring MD:  Date of Birth: 05-Apr-1956 Gender: Male Account #: [id number] Procedure:                Colonoscopy Indications:              Hematochezia, Family history of colon cancer, first                            degree relative, Personal history of adenomatous                            colon polyps. Medicines:                Monitored Anesthesia Care Procedure:                Pre-Anesthesia Assessment:                           - Prior to the procedure, a History and Physical                            was performed, and patient medications and                            allergies were reviewed. The patient's tolerance of                            previous anesthesia was also reviewed. The risks                            and benefits of the procedure and the sedation                            options and risks were discussed with the patient.                            All questions were answered, and informed consent                            was obtained. Prior Anticoagulants: The patient has                            taken no previous anticoagulant or antiplatelet                            agents. ASA Grade Assessment: II - A patient with                            mild systemic disease. After reviewing the risks                            and benefits, the patient was deemed in  satisfactory condition to undergo the procedure.                           After obtaining informed consent, the colonoscope                            was passed under direct vision. Throughout the                            procedure, the patient's blood pressure, pulse, and                            oxygen saturations were monitored continuously. The                            Colonoscope was introduced through the anus  and                            advanced to the the cecum, identified by                            appendiceal orifice and ileocecal valve. The                            ileocecal valve, appendiceal orifice, and rectum                            were photographed. The quality of the bowel                            preparation was good. The colonoscopy was performed                            without difficulty. The patient tolerated the                            procedure well. Scope In: 8:12:03 AM Scope Out: 8:23:07 AM Scope Withdrawal Time: 0 hours 8 minutes 32 seconds  Total Procedure Duration: 0 hours 11 minutes 4 seconds  Findings:                 The perianal and digital rectal examinations were                            normal.                           A 7 mm polyp was found in the transverse colon. The                            polyp was sessile. The polyp was removed with a                            cold snare. Resection and retrieval were complete.  Multiple medium-mouthed diverticula were found in                            the left colon. There was evidence of diverticular                            spasm. There was no evidence of diverticular                            bleeding.                           A few medium-mouthed diverticula were found in the                            right colon. There was no evidence of diverticular                            bleeding.                           Internal hemorrhoids were found during                            retroflexion. The hemorrhoids were medium-sized and                            Grade I (internal hemorrhoids that do not prolapse).                           The exam was otherwise without abnormality on                            direct and retroflexion views. Complications:            No immediate complications. Estimated blood loss:                            None. Estimated Blood  Loss:     Estimated blood loss: none. Impression:               - One 7 mm polyp in the transverse colon, removed                            with a cold snare. Resected and retrieved.                           - Moderate diverticulosis in the left colon.                           - Mild diverticulosis in the right colon.                           - Internal hemorrhoids.                           -  The examination was otherwise normal on direct                            and retroflexion views. Recommendation:           - Repeat colonoscopy, likely in 5 years, for                            surveillance after pathology reviewed.                           - Patient has a contact number available for                            emergencies. The signs and symptoms of potential                            delayed complications were discussed with the                            patient. Return to normal activities tomorrow.                            Written discharge instructions were provided to the                            patient.                           - High fiber diet.                           - Continue present medications.                           - Await pathology results.                           - Consider hemorrhoidal banding if hematochezia                            persistent, recurrent. Ladene Artist, MD 06/19/2019 8:34:39 AM This report has been signed electronically.

## 2019-06-19 NOTE — Patient Instructions (Signed)
Handouts given:  Diverticulosis, Hemorrhoids, polyps, stricture, post dilation diet, and high fiber diet, Hiatal Hernia Continue present medications Start high fiber diet Await pathology results Stay on nexium 40mg  every morning long term Return to GI office in one year    YOU HAD AN ENDOSCOPIC PROCEDURE TODAY AT [facility]:   Refer to the procedure report that was given to you for any specific questions about what was found during the examination.  If the procedure report does not answer your questions, please call your gastroenterologist to clarify.  If you requested that your care partner not be given the details of your procedure findings, then the procedure report has been included in a sealed envelope for you to review at your convenience later.  YOU SHOULD EXPECT: Some feelings of bloating in the abdomen. Passage of more gas than usual.  Walking can help get rid of the air that was put into your GI tract during the procedure and reduce the bloating. If you had a lower endoscopy (such as a colonoscopy or flexible sigmoidoscopy) you may notice spotting of blood in your stool or on the toilet paper. If you underwent a bowel prep for your procedure, you may not have a normal bowel movement for a few days.  Please Note:  You might notice some irritation and congestion in your nose or some drainage.  This is from the oxygen used during your procedure.  There is no need for concern and it should clear up in a day or so.  SYMPTOMS TO REPORT IMMEDIATELY:   Following lower endoscopy (colonoscopy or flexible sigmoidoscopy):  Excessive amounts of blood in the stool  Significant tenderness or worsening of abdominal pains  Swelling of the abdomen that is new, acute  Fever of 100F or higher   Following upper endoscopy (EGD)  Vomiting of blood or coffee ground material  New chest pain or pain under the shoulder blades  Painful or persistently difficult swallowing  New shortness  of breath  Fever of 100F or higher  Black, tarry-looking stools  For urgent or emergent issues, a gastroenterologist can be reached at any hour by calling [phone].   DIET:  We do recommend a small meal at first, but then you may proceed to your regular diet.  Drink plenty of fluids but you should avoid alcoholic beverages for 24 hours.  ACTIVITY:  You should plan to take it easy for the rest of today and you should NOT DRIVE or use heavy machinery until tomorrow (because of the sedation medicines used during the test).    FOLLOW UP: Our staff will call the number listed on your records 48-72 hours following your procedure to check on you and address any questions or concerns that you may have regarding the information given to you following your procedure. If we do not reach you, we will leave a message.  We will attempt to reach you two times.  During this call, we will ask if you have developed any symptoms of COVID 19. If you develop any symptoms (ie: fever, flu-like symptoms, shortness of breath, cough etc.) before then, please call [phone].  If you test positive for Covid 19 in the 2 weeks post procedure, please call and report this information to [place].    If any biopsies were taken you will be contacted by phone or by letter within the next 1-3 weeks.  Please call us at [phone] if you have not heard about the biopsies in 3  weeks.    SIGNATURES/CONFIDENTIALITY: You and/or your care partner have signed paperwork which will be entered into your electronic medical record.  These signatures attest to the fact that that the information above on your After Visit Summary has been reviewed and is understood.  Full responsibility of the confidentiality of this discharge information lies with you and/or your care-partner.

## 2019-06-19 NOTE — Op Note (Signed)
[facility] Patient Name: Jerry Cohen Procedure Date: 06/19/2019 8:09 AM MRN: [id number] Endoscopist: Ladene Artist , MD Age: 63 Referring MD:  Date of Birth: 05/11/1956 Gender: Male Account #: [id number] Procedure:                Upper GI endoscopy Indications:              Dysphagia Medicines:                Monitored Anesthesia Care Procedure:                Pre-Anesthesia Assessment:                           - Prior to the procedure, a History and Physical                            was performed, and patient medications and                            allergies were reviewed. The patient's tolerance of                            previous anesthesia was also reviewed. The risks                            and benefits of the procedure and the sedation                            options and risks were discussed with the patient.                            All questions were answered, and informed consent                            was obtained. Prior Anticoagulants: The patient has                            taken no previous anticoagulant or antiplatelet                            agents. ASA Grade Assessment: II - A patient with                            mild systemic disease. After reviewing the risks                            and benefits, the patient was deemed in                            satisfactory condition to undergo the procedure.                           After obtaining informed consent, the endoscope was  passed under direct vision. Throughout the                            procedure, the patient's blood pressure, pulse, and                            oxygen saturations were monitored continuously. The                            Endoscope was introduced through the mouth, and                            advanced to the second part of duodenum. The upper                            GI endoscopy was accomplished without  difficulty.                            The patient tolerated the procedure well. Scope In: Scope Out: Findings:                 LA Grade B (one or more mucosal breaks greater than                            5 mm, not extending between the tops of two mucosal                            folds) esophagitis with no bleeding was found at                            the gastroesophageal junction.                           One benign-appearing, intrinsic mild stenosis was                            found at the gastroesophageal junction. This                            stenosis measured 1.2 cm (inner diameter) x less                            than one cm (in length). The stenosis was                            traversed. A guidewire was placed and the scope was                            withdrawn. Dilations were performed with Savary                            dilators with no resistance at 13 mm and 14 mm,  mild resistance at 15 mm and 16 mm. No heme noted.                           The exam of the esophagus was otherwise normal.                           A small hiatal hernia was present.                           The exam of the stomach was otherwise normal.                           The duodenal bulb and second portion of the                            duodenum were normal. Complications:            No immediate complications. Estimated Blood Loss:     Estimated blood loss: none. Impression:               - LA Grade B reflux esophagitis with no bleeding.                           - Benign-appearing esophageal stenosis. Dilated.                           - Small hiatal hernia.                           - Normal duodenal bulb and second portion of the                            duodenum.                           - No specimens collected. Recommendation:           - Patient has a contact number available for                            emergencies. The signs and  symptoms of potential                            delayed complications were discussed with the                            patient. Return to normal activities tomorrow.                            Written discharge instructions were provided to the                            patient.                           - Clear liquid diet for 2 hours, then advance as  tolerated to soft diet today.                           - Resume prior diet tomorrow.                           - Antireflux measures long term.                           - Continue present medications including Nexium 40                            mg po qam long term.                           - Return to GI office in 1 year. Ladene Artist, MD 06/19/2019 8:40:16 AM This report has been signed electronically.

## 2019-06-19 NOTE — Progress Notes (Signed)
Report given to PACU, vss 

## 2019-06-24 ENCOUNTER — Telehealth: Payer: Self-pay

## 2019-06-24 NOTE — Telephone Encounter (Signed)
Left message on follow up call. 

## 2019-06-24 NOTE — Telephone Encounter (Signed)
  Follow up Call-  Call back number 06/19/2019  Post procedure Call Back phone  # [phone]  Permission to leave phone message Yes  Some recent data might be hidden     Patient questions:  Do you have a fever, pain , or abdominal swelling? No. Pain Score  0 *  Have you tolerated food without any problems? Yes.    Have you been able to return to your normal activities? Yes.    Do you have any questions about your discharge instructions: Diet   No. Medications  No. Follow up visit  No.  Do you have questions or concerns about your Care? No.  Actions: * If pain score is 4 or above: No action needed, pain <4. 1. Have you developed a fever since your procedure? no  2.   Have you had an respiratory symptoms (SOB or cough) since your procedure? no  3.   Have you tested positive for COVID 19 since your procedure no  4.   Have you had any family members/close contacts diagnosed with the COVID 19 since your procedure?  no   If yes to any of these questions please route to Joylene John, RN and Alphonsa Gin, Therapist, sports.

## 2019-06-28 ENCOUNTER — Encounter: Payer: Self-pay | Admitting: Gastroenterology

## 2019-07-26 ENCOUNTER — Other Ambulatory Visit: Payer: Self-pay | Admitting: Internal Medicine

## 2019-07-26 ENCOUNTER — Other Ambulatory Visit: Payer: Self-pay | Admitting: Cardiology

## 2019-08-15 ENCOUNTER — Encounter: Payer: Self-pay | Admitting: Internal Medicine

## 2019-08-15 ENCOUNTER — Other Ambulatory Visit: Payer: Self-pay

## 2019-08-15 ENCOUNTER — Ambulatory Visit (INDEPENDENT_AMBULATORY_CARE_PROVIDER_SITE_OTHER): Payer: Commercial Managed Care - PPO | Admitting: Internal Medicine

## 2019-08-15 ENCOUNTER — Ambulatory Visit: Payer: Commercial Managed Care - PPO | Admitting: Internal Medicine

## 2019-08-15 DIAGNOSIS — I2510 Atherosclerotic heart disease of native coronary artery without angina pectoris: Secondary | ICD-10-CM | POA: Diagnosis not present

## 2019-08-15 DIAGNOSIS — I712 Thoracic aortic aneurysm, without rupture: Secondary | ICD-10-CM

## 2019-08-15 DIAGNOSIS — E1165 Type 2 diabetes mellitus with hyperglycemia: Secondary | ICD-10-CM | POA: Diagnosis not present

## 2019-08-15 DIAGNOSIS — I7121 Aneurysm of the ascending aorta, without rupture: Secondary | ICD-10-CM

## 2019-08-15 DIAGNOSIS — I10 Essential (primary) hypertension: Secondary | ICD-10-CM

## 2019-08-15 NOTE — Assessment & Plan Note (Addendum)
No angina He will need to find a new cardiologist in [place] or [place]. Toprol, Lipitor, aspirin Cath 08/2018 showed 40% oD1, 70% D2, 60-70% mLCx and 90% pPDA (RCA codominant). He has not had any anginal sx.  He will continue on Toprol to 50 mg daily, high dose statin and ASA 81mg  daily.

## 2019-08-15 NOTE — Assessment & Plan Note (Signed)
She is due chest CT in March 2021

## 2019-08-15 NOTE — Assessment & Plan Note (Signed)
Continue with Norvasc and Diovan HCT

## 2019-08-15 NOTE — Assessment & Plan Note (Signed)
On Prandin Continue with weight loss effort

## 2019-08-15 NOTE — Progress Notes (Signed)
Virtual Visit via Video Note  I connected with Jerry Cohen on 08/15/19 at 10:00 AM EST by a video enabled telemedicine application and verified that I am speaking with the correct person using two identifiers.   I discussed the limitations of evaluation and management by telemedicine and the availability of in person appointments. The patient expressed understanding and agreed to proceed.  History of Present Illness: We need to follow-up on hypertension, coronary artery disease, diabetes.  The patient has retired.  He sold his house in [place] and is moving to [place], [place] soon  There has been no runny nose, cough, chest pain, shortness of breath, abdominal pain, diarrhea, constipation, arthralgias, skin rashes.   Observations/Objective: The patient appears to be in no acute distress, looks well.  Assessment and Plan:  See my Assessment and Plan. Follow Up Instructions:    I discussed the assessment and treatment plan with the patient. The patient was provided an opportunity to ask questions and all were answered. The patient agreed with the plan and demonstrated an understanding of the instructions.   The patient was advised to call back or seek an in-person evaluation if the symptoms worsen or if the condition fails to improve as anticipated.  I provided face-to-face time during this encounter. We were at different locations.   Walker Kehr, MD

## 2019-08-20 ENCOUNTER — Ambulatory Visit: Payer: Commercial Managed Care - PPO | Admitting: Internal Medicine

## 2019-09-15 ENCOUNTER — Inpatient Hospital Stay: Admission: RE | Admit: 2019-09-15 | Payer: Commercial Managed Care - PPO | Source: Ambulatory Visit

## 2019-09-20 ENCOUNTER — Other Ambulatory Visit: Payer: Self-pay | Admitting: Internal Medicine

## 2019-09-23 ENCOUNTER — Other Ambulatory Visit: Payer: Self-pay | Admitting: Cardiovascular Disease

## 2019-09-23 MED ORDER — METOPROLOL SUCCINATE ER 25 MG PO TB24: ORAL_TABLET | ORAL | 1 refills | Status: DC

## 2019-09-23 MED ORDER — METOPROLOL SUCCINATE ER 50 MG PO TB24: 50.0000 mg | ORAL_TABLET | Freq: Every day | ORAL | 1 refills | Status: DC

## 2019-09-24 ENCOUNTER — Other Ambulatory Visit: Payer: Self-pay | Admitting: Internal Medicine

## 2019-10-14 ENCOUNTER — Other Ambulatory Visit: Payer: Self-pay | Admitting: Cardiology

## 2019-10-15 IMAGING — CR CERVICAL SPINE - 2-3 VIEW
2 series · 2 of 2 positions shown · non-contrast
Comparison: April 18, 2018

CLINICAL DATA: Anterior cervical decompression.

EXAM:
CERVICAL SPINE - 2-3 VIEW

[AP (1 of 2)]
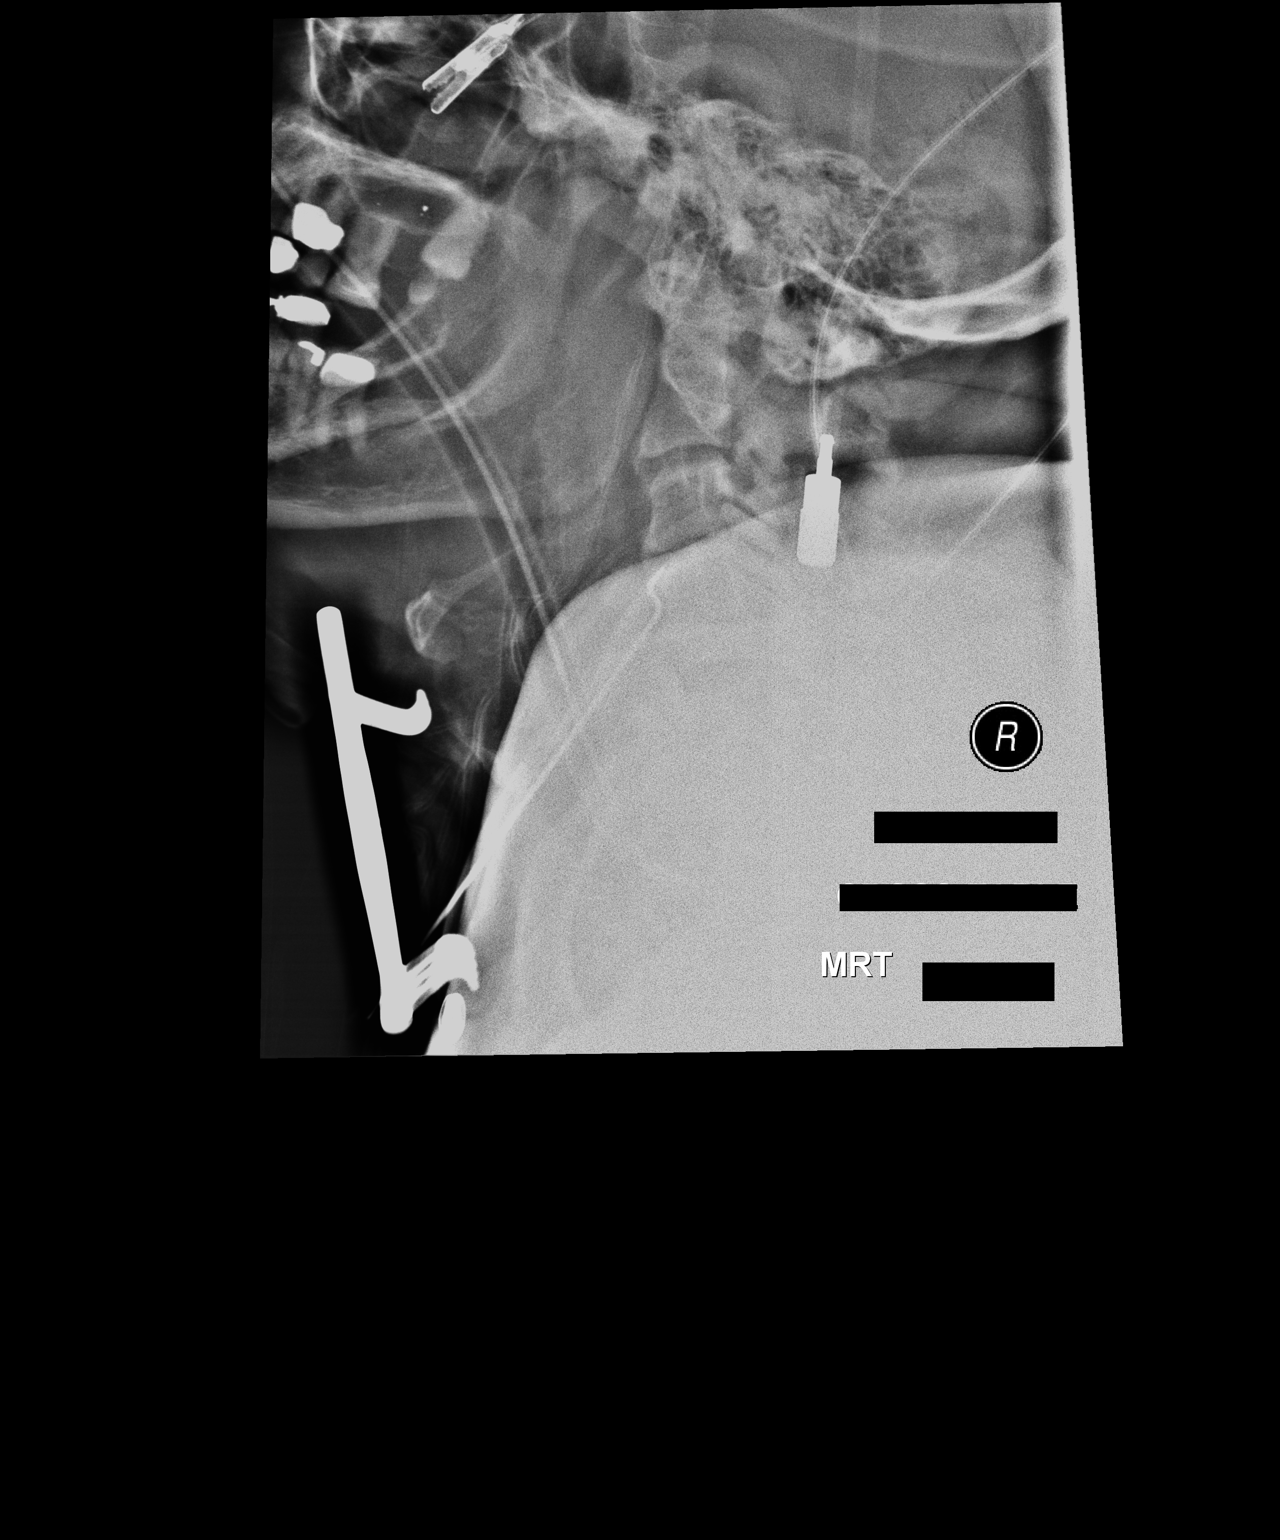

[AP (2 of 2)]
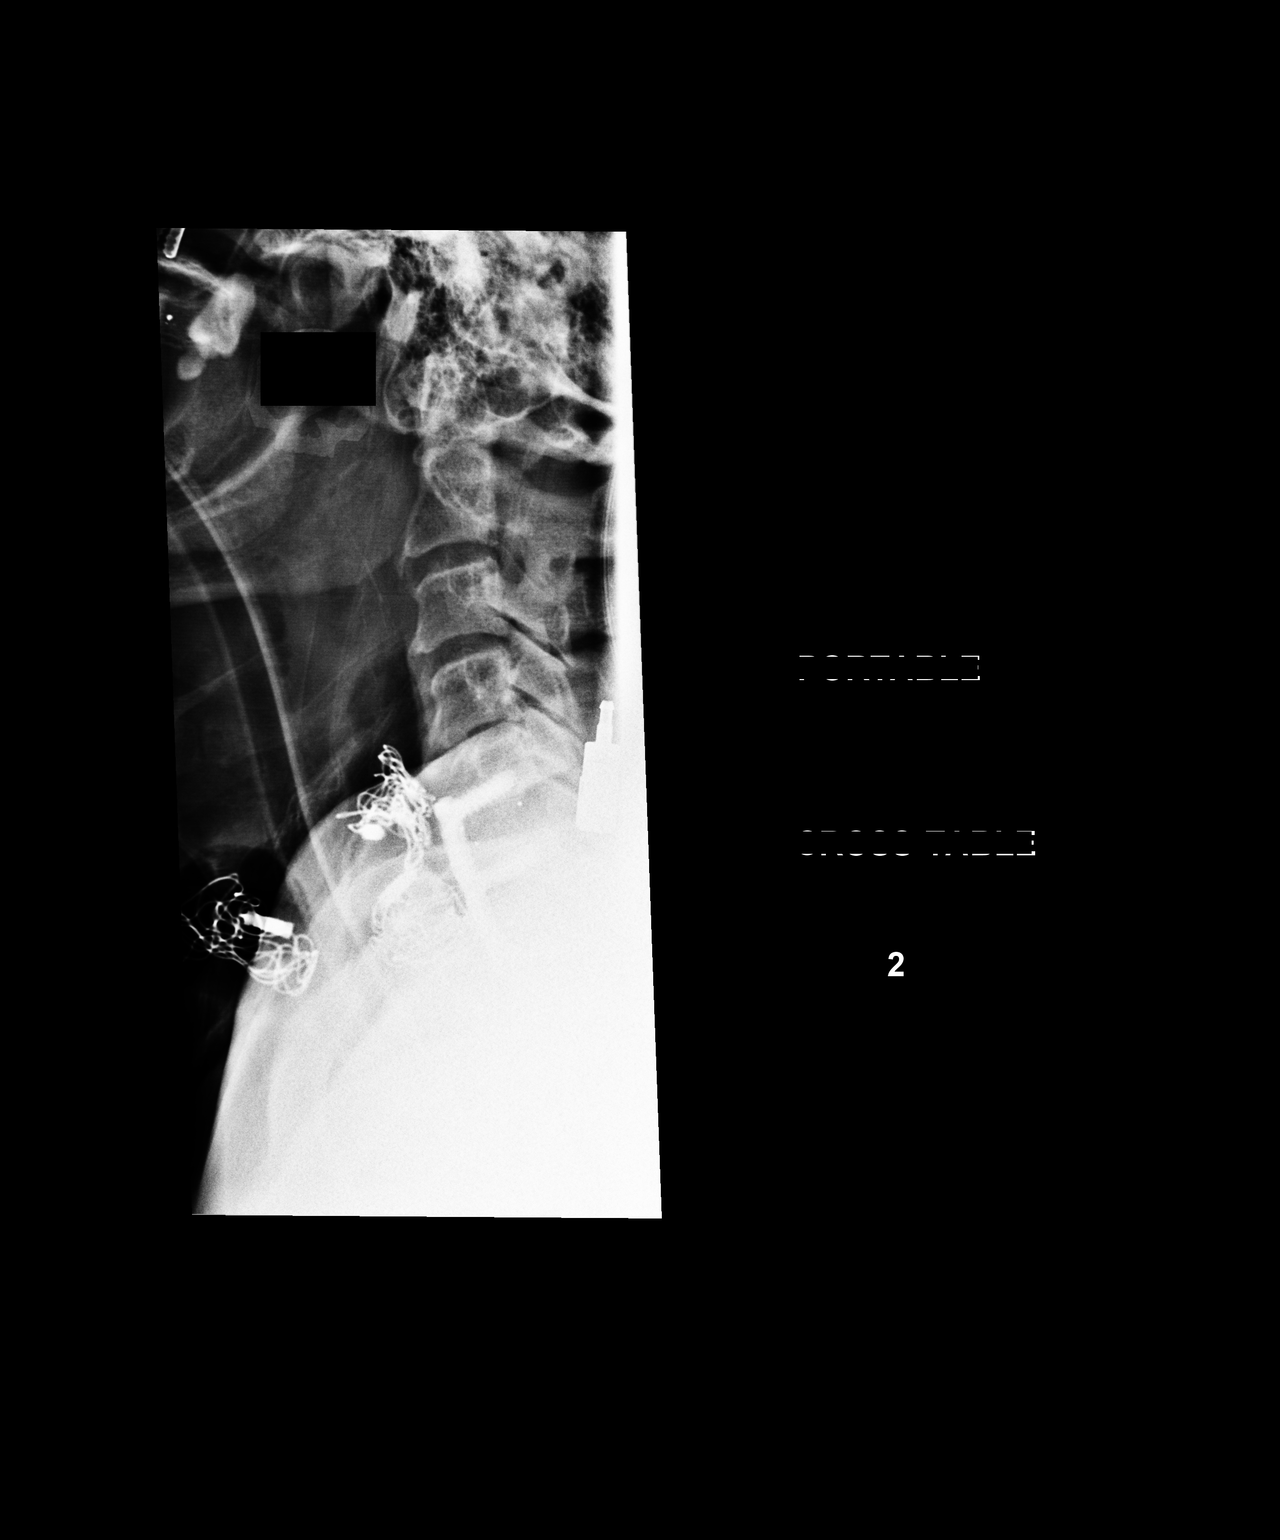

[2 of 2 positions shown; findings below may reference images not displayed]

FINDINGS: Portable lateral radiographs of the cervical spine demonstrate an
interval anterior fusion at C5-C7, poorly visualized due to
overlying soft tissues. Grossly normal alignment of the cervical
spine.

Endotracheal tube and surgical sponges noted.
IMPRESSION: Post anterior fusion at C5-C7.

## 2020-06-17 ENCOUNTER — Other Ambulatory Visit: Payer: Self-pay | Admitting: Internal Medicine

## 2020-07-22 ENCOUNTER — Other Ambulatory Visit: Payer: Self-pay | Admitting: Cardiology

## 2020-08-21 ENCOUNTER — Other Ambulatory Visit: Payer: Self-pay | Admitting: Cardiology

## 2020-08-23 ENCOUNTER — Other Ambulatory Visit: Payer: Self-pay | Admitting: Cardiology

## 2020-08-23 MED ORDER — METOPROLOL SUCCINATE ER 50 MG PO TB24: 50.0000 mg | ORAL_TABLET | Freq: Every day | ORAL | 0 refills | Status: AC

## 2020-08-31 ENCOUNTER — Other Ambulatory Visit: Payer: Self-pay | Admitting: Cardiology

## 2020-09-07 ENCOUNTER — Other Ambulatory Visit: Payer: Self-pay | Admitting: Cardiology

## 2020-09-07 NOTE — Telephone Encounter (Signed)
Needs to get from pcp or make aapointment

## 2020-09-22 ENCOUNTER — Other Ambulatory Visit: Payer: Self-pay | Admitting: Cardiology

## 2020-09-29 ENCOUNTER — Other Ambulatory Visit: Payer: Self-pay | Admitting: Cardiology

## 2020-10-14 ENCOUNTER — Other Ambulatory Visit: Payer: Self-pay | Admitting: Cardiology

## 2020-10-17 ENCOUNTER — Other Ambulatory Visit: Payer: Self-pay | Admitting: Cardiology

## 2020-10-29 ENCOUNTER — Other Ambulatory Visit: Payer: Self-pay | Admitting: Cardiology

## 2021-02-07 ENCOUNTER — Other Ambulatory Visit: Payer: Self-pay | Admitting: Cardiology

## 2021-02-09 ENCOUNTER — Other Ambulatory Visit: Payer: Self-pay | Admitting: Cardiology

## 2021-08-07 ENCOUNTER — Other Ambulatory Visit: Payer: Self-pay | Admitting: Internal Medicine
